# Patient Record
Sex: Male | Born: 1935 | Race: White | Hispanic: No | Marital: Married | Smoking: Never smoker
Health system: Southern US, Community
[De-identification: ages and names within clinical notes are randomized; demographics above are authoritative.]

## PROBLEM LIST (undated history)

## (undated) DIAGNOSIS — Z87891 Personal history of nicotine dependence: Secondary | ICD-10-CM

## (undated) DIAGNOSIS — D72829 Elevated white blood cell count, unspecified: Secondary | ICD-10-CM

## (undated) DIAGNOSIS — N189 Chronic kidney disease, unspecified: Secondary | ICD-10-CM

## (undated) DIAGNOSIS — R634 Abnormal weight loss: Secondary | ICD-10-CM

## (undated) DIAGNOSIS — E785 Hyperlipidemia, unspecified: Secondary | ICD-10-CM

## (undated) DIAGNOSIS — K219 Gastro-esophageal reflux disease without esophagitis: Secondary | ICD-10-CM

## (undated) DIAGNOSIS — D631 Anemia in chronic kidney disease: Secondary | ICD-10-CM

## (undated) DIAGNOSIS — N400 Enlarged prostate without lower urinary tract symptoms: Secondary | ICD-10-CM

## (undated) DIAGNOSIS — D649 Anemia, unspecified: Secondary | ICD-10-CM

## (undated) HISTORY — DX: Abnormal weight loss: R63.4

## (undated) HISTORY — DX: Elevated white blood cell count, unspecified: D72.829

## (undated) HISTORY — DX: Anemia in chronic kidney disease: D63.1

## (undated) HISTORY — DX: Chronic kidney disease, unspecified: N18.9

## (undated) HISTORY — DX: Gastro-esophageal reflux disease without esophagitis: K21.9

## (undated) HISTORY — DX: Anemia, unspecified: D64.9

## (undated) HISTORY — DX: Benign prostatic hyperplasia without lower urinary tract symptoms: N40.0

## (undated) HISTORY — DX: Hyperlipidemia, unspecified: E78.5

## (undated) HISTORY — DX: Personal history of nicotine dependence: Z87.891

---

## 1997-07-02 ENCOUNTER — Other Ambulatory Visit: Admission: RE | Admit: 1997-07-02 | Discharge: 1997-07-02 | Payer: Self-pay | Admitting: Family Medicine

## 2000-01-27 ENCOUNTER — Ambulatory Visit (HOSPITAL_COMMUNITY): Admission: RE | Admit: 2000-01-27 | Discharge: 2000-01-27 | Payer: Self-pay | Admitting: Family Medicine

## 2000-01-27 ENCOUNTER — Ambulatory Visit (HOSPITAL_COMMUNITY): Admission: RE | Admit: 2000-01-27 | Discharge: 2000-01-27 | Payer: Self-pay | Admitting: Vascular Surgery

## 2000-01-27 ENCOUNTER — Encounter: Payer: Self-pay | Admitting: Vascular Surgery

## 2000-02-25 ENCOUNTER — Encounter: Payer: Self-pay | Admitting: Cardiology

## 2000-02-25 ENCOUNTER — Ambulatory Visit (HOSPITAL_COMMUNITY): Admission: RE | Admit: 2000-02-25 | Discharge: 2000-02-26 | Payer: Self-pay | Admitting: Cardiology

## 2000-07-05 ENCOUNTER — Inpatient Hospital Stay (HOSPITAL_COMMUNITY): Admission: AD | Admit: 2000-07-05 | Discharge: 2000-07-12 | Payer: Self-pay | Admitting: Cardiology

## 2000-07-07 ENCOUNTER — Encounter: Payer: Self-pay | Admitting: Cardiology

## 2000-07-11 ENCOUNTER — Encounter: Payer: Self-pay | Admitting: Cardiology

## 2001-04-27 ENCOUNTER — Ambulatory Visit (HOSPITAL_COMMUNITY): Admission: RE | Admit: 2001-04-27 | Discharge: 2001-04-28 | Payer: Self-pay | Admitting: Cardiology

## 2001-04-27 ENCOUNTER — Encounter: Payer: Self-pay | Admitting: Cardiology

## 2001-11-07 ENCOUNTER — Encounter: Payer: Self-pay | Admitting: Cardiology

## 2001-11-07 ENCOUNTER — Ambulatory Visit (HOSPITAL_COMMUNITY): Admission: RE | Admit: 2001-11-07 | Discharge: 2001-11-07 | Payer: Self-pay | Admitting: Cardiology

## 2002-10-27 ENCOUNTER — Encounter: Payer: Self-pay | Admitting: Emergency Medicine

## 2002-10-27 ENCOUNTER — Emergency Department (HOSPITAL_COMMUNITY): Admission: EM | Admit: 2002-10-27 | Discharge: 2002-10-27 | Payer: Self-pay | Admitting: *Deleted

## 2002-11-18 ENCOUNTER — Encounter: Payer: Self-pay | Admitting: Emergency Medicine

## 2002-11-18 ENCOUNTER — Emergency Department (HOSPITAL_COMMUNITY): Admission: EM | Admit: 2002-11-18 | Discharge: 2002-11-18 | Payer: Self-pay | Admitting: Emergency Medicine

## 2002-11-23 ENCOUNTER — Encounter: Payer: Self-pay | Admitting: Family Medicine

## 2002-11-23 ENCOUNTER — Encounter: Admission: RE | Admit: 2002-11-23 | Discharge: 2002-11-23 | Payer: Self-pay | Admitting: Family Medicine

## 2002-12-03 ENCOUNTER — Encounter (INDEPENDENT_AMBULATORY_CARE_PROVIDER_SITE_OTHER): Payer: Self-pay | Admitting: *Deleted

## 2002-12-03 ENCOUNTER — Inpatient Hospital Stay (HOSPITAL_COMMUNITY): Admission: RE | Admit: 2002-12-03 | Discharge: 2002-12-05 | Payer: Self-pay | Admitting: Interventional Radiology

## 2007-04-04 ENCOUNTER — Encounter: Admission: RE | Admit: 2007-04-04 | Discharge: 2007-04-04 | Payer: Self-pay | Admitting: Gastroenterology

## 2007-04-27 ENCOUNTER — Ambulatory Visit (HOSPITAL_COMMUNITY): Admission: RE | Admit: 2007-04-27 | Discharge: 2007-04-27 | Payer: Self-pay | Admitting: Gastroenterology

## 2010-02-13 ENCOUNTER — Inpatient Hospital Stay (HOSPITAL_COMMUNITY)
Admission: EM | Admit: 2010-02-13 | Discharge: 2010-02-22 | Disposition: A | Discharge status: Still In Hospital | Payer: Self-pay | Source: Home / Self Care | Attending: Internal Medicine | Admitting: Internal Medicine

## 2010-02-13 ENCOUNTER — Emergency Department (HOSPITAL_COMMUNITY)
Admission: EM | Admit: 2010-02-13 | Discharge: 2010-02-13 | Disposition: A | Discharge status: Other Acute Inpatient Hospital | Payer: Self-pay | Source: Home / Self Care | Admitting: Emergency Medicine

## 2010-02-14 ENCOUNTER — Encounter (INDEPENDENT_AMBULATORY_CARE_PROVIDER_SITE_OTHER): Payer: Self-pay | Admitting: Internal Medicine

## 2010-02-17 ENCOUNTER — Encounter (INDEPENDENT_AMBULATORY_CARE_PROVIDER_SITE_OTHER): Payer: Self-pay | Admitting: Internal Medicine

## 2010-03-03 ENCOUNTER — Telehealth (INDEPENDENT_AMBULATORY_CARE_PROVIDER_SITE_OTHER): Payer: Self-pay | Admitting: *Deleted

## 2010-03-05 ENCOUNTER — Encounter: Payer: Self-pay | Admitting: Critical Care Medicine

## 2010-03-16 DIAGNOSIS — J180 Bronchopneumonia, unspecified organism: Secondary | ICD-10-CM | POA: Insufficient documentation

## 2010-03-16 DIAGNOSIS — I4891 Unspecified atrial fibrillation: Secondary | ICD-10-CM | POA: Insufficient documentation

## 2010-03-16 DIAGNOSIS — I2510 Atherosclerotic heart disease of native coronary artery without angina pectoris: Secondary | ICD-10-CM | POA: Insufficient documentation

## 2010-03-16 DIAGNOSIS — I5031 Acute diastolic (congestive) heart failure: Secondary | ICD-10-CM | POA: Insufficient documentation

## 2010-03-16 DIAGNOSIS — D72829 Elevated white blood cell count, unspecified: Secondary | ICD-10-CM | POA: Insufficient documentation

## 2010-03-16 DIAGNOSIS — E236 Other disorders of pituitary gland: Secondary | ICD-10-CM | POA: Insufficient documentation

## 2010-03-17 ENCOUNTER — Ambulatory Visit
Admission: RE | Admit: 2010-03-17 | Discharge: 2010-03-17 | Payer: Self-pay | Source: Home / Self Care | Attending: Critical Care Medicine | Admitting: Critical Care Medicine

## 2010-04-09 NOTE — Progress Notes (Signed)
Summary: hosp f/u appt with PW  Phone Note Call from Patient Call back at Home Phone [phone]   Caller: Wife, Jose Atkins Call For: Jose Atkins Summary of Call: Pt's wife, Jose Atkins, calling to schedule pt for a post hosp f/u appt with PW.  Wife states pt was seen while at [facility] in Dec with Dr. Joya Atkins and needed to schedule a 1 to 2 week hosp f/u appt.  Wife states pt already has appt scheduled with Dr. Wynonia Atkins for 03/10/2009 at 10:45 and wanted to see if PW can see him that day as well.  PW only in office that PM and schedule is full.  Please advise.  Thanks.  Jinny Blossom Reynolds LPN  December 27, 624THL 9:51 AM  Initial call taken by: Matthew Folks LPN,  December 27, 624THL 9:52 AM  Follow-up for Phone Call        Consult note from [place] in echart.    Called, spoke with pt's wife.  She has now changed her mind and does not want to come in same day as Dr. Thurman Atkins appt as this will "probably be too much."  HFU scheduled with PW for 03/17/2009 at [facility] aware. Follow-up by: Raymondo Band RN,  March 03, 2010 2:01 PM

## 2010-04-09 NOTE — Assessment & Plan Note (Signed)
Summary: Post Hosp Pulmonary OV   Primary Provider/Referring Provider:  Dr. Daiva Eves @ [facility]:  [place].  Pt states breathing has improved.  Coughs some with white/clear mucus.  Denies wheezing, chest tightness, and cough. .  History of Present Illness: Pulmonary OV  This pt is seen in post hosp f/u.  Pt is doing well and is less dyspneic.  Pt in hosp for hemoptysis due to bronchopneumonia and CHF diastolic.  Pt rx with ABX. FOB done showed no endobronchial lesion. CXR in f/u was neg and all infiltrates clear. Pt off oxygen and doing well. No chest pain. Recent WBC back to normal at 11.2 Also had hyponatremia,  SNa 137 at last check on 12/29.  Pt still on demeclocycline. Pt with afib and has f/u with dr Wynonia Lawman. Needs anticoagulation.    All c/s neg from fob. Pt on pulmicort now     Current Medications (verified): 1)  Pulmicort Flexhaler 180 Mcg/act Aepb (Budesonide) .... Inhale 1 Puff Two Times A Day 2)  Digoxin 0.125 Mg Tabs (Digoxin) .... Take 1 Tablet By Mouth Once A Day 3)  Diltiazem Hcl Cr 240 Mg Xr24h-Cap (Diltiazem Hcl) .... Take 1 Tablet By Mouth Two Times A Day 4)  Colace 100 Mg Caps (Docusate Sodium) .... Take 1 Capsule By Mouth Two Times A Day 5)  Avodart 0.5 Mg Caps (Dutasteride) .... Take 1 Capsule By Mouth Once A Day 6)  Cheratussin Ac 100-10 Mg/66ml Syrp (Guaifenesin-Codeine) .... As Needed 7)  Milk of Magnesia 400 Mg/44ml Susp (Magnesium Hydroxide) .... 73ml By Mouth Every 6 Hours As Needed 8)  Spiriva Handihaler 18 Mcg Caps (Tiotropium Bromide Monohydrate) .... Once Daily 9)  Aspirin 81 Mg Tbec (Aspirin) .... Take 1 Tablet By Mouth Once A Day 10)  Multivitamins  Tabs (Multiple Vitamin) .... Take 1 Tablet By Mouth Once A Day 11)  Omeprazole 40 Mg Cpdr (Omeprazole) .... Take 1 Tablet By Mouth Once A Day 12)  Simvastatin 40 Mg Tabs (Simvastatin) .... Take 1 Tab By Mouth At Bedtime 13)  Demeclocycline Hcl 150 Mg Tabs (Demeclocycline Hcl) .... Take 1 Tablet  By Mouth Two Times A Day  Allergies (verified): 1)  ! * Tequin  Past History:  Past medical, surgical, family and social histories (including risk factors) reviewed, and no changes noted (except as noted below).  Past Medical History: BENIGN PROSTATIC HYPERTROPHY, HX OF (ICD-600.01) SIADH (ICD-253.6) CAD (ICD-414.00)      -RCA stent 2002 Dr Olevia Perches ATRIAL FIBRILLATION (ICD-427.31) BRONCHOPNEUMONIA [phone])    -12/11:  resolved as of 03/17/10    -hemoptysis with PNA>>>neg Fob, all c/s neg, no CA [id number] Chronic  DIASTOLIC HEART FAILURE (99991111)    -improved    -echo 12/11:  EF [id number] grade 2 diastolic CHF  Past Surgical History: heart cath     -stent RCA 2002 Kyphoplasty T6  T12     -2002  Family History: Reviewed history and no changes required. father - MI  Social History: Reviewed history from 03/16/2010 and no changes required. 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  Quit in 1960's. 4 children Retired Architect  Review of Systems       The patient complains of shortness of breath with activity.  The patient denies shortness of breath at rest, productive cough, non-productive cough, coughing up blood, chest pain, irregular heartbeats, acid heartburn, indigestion, loss of appetite, weight change, abdominal pain, difficulty swallowing, sore throat, tooth/dental problems, headaches, nasal congestion/difficulty breathing through nose,  sneezing, itching, ear ache, anxiety, depression, hand/feet swelling, joint stiffness or pain, rash, change in color of mucus, and fever.    Vital Signs:  Patient profile:   77 year old male Height:      67 inches Weight:      132.25 pounds BMI:     20.79 O2 Sat:      97 % on Room air Temp:     97.8 degrees F oral Pulse rate:   101 / minute BP sitting:   158 / 60  (left arm) Cuff size:   regular  Vitals Entered By: Raymondo Band RN (March 17, 2010 8:50 AM)  O2 Flow:  Room air CC: HFU.  Pt states breathing has improved.   Coughs some with white/clear mucus.  Denies wheezing, chest tightness, cough.  Comments Medications reviewed with patient Daytime contact number verified with patient. Crystal Jones RN  March 17, 2010 8:51 AM    Physical Exam  Additional Exam:  Gen: Pleasant, well-nourished, in no distress,  normal affect ENT: No lesions,  mouth clear,  oropharynx clear, no postnasal drip Neck: No JVD, no TMG, no carotid bruits Lungs: No use of accessory muscles, no dullness to percussion, clear without rales or rhonchi Cardiovascular:irreg, irreg,  heart sounds normal, no murmur or gallops, no peripheral edema Abdomen: soft and NT, no HSM,  BS normal Musculoskeletal: No deformities, no cyanosis or clubbing Neuro: alert, non focal Skin: Warm, no lesions or rashes    CXR  Procedure date:  03/17/2010  Findings:      IMPRESSION:   1.  Small bilateral pleural effusions. 2.  Left base opacity which may represent atelectasis or infiltrate.>>>>looks like scar/atelectasis, doubt PNA  Impression & Recommendations:  Problem # 1:  BRONCHOPNEUMONIA (ICD-485) Assessment Improved recent bronchopneumonia, now resolved.  CXR shows resolution of infiltrates. Ok to anticoagulate now and will notify dr Wynonia Lawman plan resume coumadin no further ABX needed pulm f/u is as needed when current pulmicort inhaler out , d/c  Orders: Est. Patient Level IV (99214) T-2 View CXR (71020TC)  Problem # 2:  ACUTE DIASTOLIC HEART FAILURE (99991111) Assessment: Improved resolved CHF diastolic per cardiology His updated medication list for this problem includes:    Digoxin 0.125 Mg Tabs (Digoxin) .Marland Kitchen... Take 1 tablet by mouth once a day    Aspirin 81 Mg Tbec (Aspirin) .Marland Kitchen... Take 1 tablet by mouth once a day  Problem # 3:  SIADH (ICD-253.6) Assessment: Improved siadh was due to pulm process, now resolved plan d/c demeclocycline  Medications Added to Medication List This Visit: 1)  Pulmicort Flexhaler 180  Mcg/act Aepb (Budesonide) .... Inhale 1 puff two times a day 2)  Pulmicort Flexhaler 180 Mcg/act Aepb (Budesonide) .... Inhale 1 puff two times a day 3)  Avodart 0.5 Mg Caps (Dutasteride) .... Take 1 capsule by mouth once a day 4)  Cheratussin Ac 100-10 Mg/40ml Syrp (Guaifenesin-codeine) .... As needed 5)  Demeclocycline Hcl 150 Mg Tabs (Demeclocycline hcl) .... Take 1 tablet by mouth two times a day  Complete Medication List: 1)  Pulmicort Flexhaler 180 Mcg/act Aepb (Budesonide) .... Inhale 1 puff two times a day 2)  Digoxin 0.125 Mg Tabs (Digoxin) .... Take 1 tablet by mouth once a day 3)  Diltiazem Hcl Cr 240 Mg Xr24h-cap (Diltiazem hcl) .... Take 1 tablet by mouth two times a day 4)  Colace 100 Mg Caps (Docusate sodium) .... Take 1 capsule by mouth two times a day 5)  Avodart 0.5 Mg Caps (Dutasteride) .Marland KitchenMarland KitchenMarland Kitchen  Take 1 capsule by mouth once a day 6)  Cheratussin Ac 100-10 Mg/42ml Syrp (Guaifenesin-codeine) .... As needed 7)  Milk of Magnesia 400 Mg/51ml Susp (Magnesium hydroxide) .... 82ml by mouth every 6 hours as needed 8)  Spiriva Handihaler 18 Mcg Caps (Tiotropium bromide monohydrate) .... Once daily 9)  Aspirin 81 Mg Tbec (Aspirin) .... Take 1 tablet by mouth once a day 10)  Multivitamins Tabs (Multiple vitamin) .... Take 1 tablet by mouth once a day 11)  Omeprazole 40 Mg Cpdr (Omeprazole) .... Take 1 tablet by mouth once a day 12)  Simvastatin 40 Mg Tabs (Simvastatin) .... Take 1 tab by mouth at bedtime  Patient Instructions: 1)  No further antibiotics 2)  You may be anticoagulated, I will call Dr Wynonia Lawman 3)  When pulmicort inhaler you have now runs out do not refill 4)  Stop demeclocycline 5)  Chest xray today , we will call with result  6)  Return to pulmonary as needed     Appended Document: [place] [facility] OV fax Cristela Blue hamrick; spencer Wynonia Lawman

## 2010-04-29 ENCOUNTER — Other Ambulatory Visit: Payer: Self-pay | Admitting: Oncology

## 2010-04-29 ENCOUNTER — Encounter: Payer: Self-pay | Admitting: Oncology

## 2010-04-29 ENCOUNTER — Encounter (HOSPITAL_BASED_OUTPATIENT_CLINIC_OR_DEPARTMENT_OTHER): Payer: Medicare Other | Admitting: Oncology

## 2010-04-29 DIAGNOSIS — I2510 Atherosclerotic heart disease of native coronary artery without angina pectoris: Secondary | ICD-10-CM

## 2010-04-29 DIAGNOSIS — D649 Anemia, unspecified: Secondary | ICD-10-CM

## 2010-04-29 DIAGNOSIS — N189 Chronic kidney disease, unspecified: Secondary | ICD-10-CM

## 2010-04-29 DIAGNOSIS — D72829 Elevated white blood cell count, unspecified: Secondary | ICD-10-CM

## 2010-04-29 LAB — CBC & DIFF AND RETIC
BASO%: 0.2 % (ref 0.0–2.0)
Basophils Absolute: 0 10*3/uL (ref 0.0–0.1)
EOS%: 2.2 % (ref 0.0–7.0)
Eosinophils Absolute: 0.3 10*3/uL (ref 0.0–0.5)
HCT: 32.8 % — ABNORMAL LOW (ref 38.4–49.9)
HGB: 10.8 g/dL — ABNORMAL LOW (ref 13.0–17.1)
Immature Retic Fract: 9.6 % (ref 0.00–13.40)
LYMPH%: 11.5 % — ABNORMAL LOW (ref 14.0–49.0)
MCH: 28.7 pg (ref 27.2–33.4)
MCHC: 32.9 g/dL (ref 32.0–36.0)
MCV: 87.2 fL (ref 79.3–98.0)
MONO#: 0.9 10*3/uL (ref 0.1–0.9)
MONO%: 6.4 % (ref 0.0–14.0)
NEUT#: 11.4 10*3/uL — ABNORMAL HIGH (ref 1.5–6.5)
NEUT%: 79.7 % — ABNORMAL HIGH (ref 39.0–75.0)
Platelets: 271 10*3/uL (ref 140–400)
RBC: 3.76 10*6/uL — ABNORMAL LOW (ref 4.20–5.82)
RDW: 16.1 % — ABNORMAL HIGH (ref 11.0–14.6)
Retic %: 1.11 % (ref 0.50–1.60)
Retic Ct Abs: 41.74 10*3/uL (ref 24.10–77.50)
WBC: 14.3 10*3/uL — ABNORMAL HIGH (ref 4.0–10.3)
lymph#: 1.6 10*3/uL (ref 0.9–3.3)

## 2010-04-29 LAB — CHCC SMEAR

## 2010-04-29 LAB — MORPHOLOGY
PLT EST: ADEQUATE
RBC Comments: NORMAL

## 2010-04-30 ENCOUNTER — Other Ambulatory Visit: Payer: Self-pay | Admitting: Oncology

## 2010-05-01 LAB — PROTEIN ELECTROPHORESIS, SERUM
Gamma Globulin: 17.2 % (ref 11.1–18.8)
Total Protein, Serum Electrophoresis: 6 g/dL (ref 6.0–8.3)

## 2010-05-01 LAB — COMPREHENSIVE METABOLIC PANEL
ALT: 13 U/L (ref 0–53)
AST: 18 U/L (ref 0–37)
CO2: 22 mEq/L (ref 19–32)
Chloride: 101 mEq/L (ref 96–112)
Creatinine, Ser: 1.42 mg/dL (ref 0.40–1.50)
Sodium: 136 mEq/L (ref 135–145)
Total Bilirubin: 0.4 mg/dL (ref 0.3–1.2)
Total Protein: 6 g/dL (ref 6.0–8.3)

## 2010-05-01 LAB — KAPPA/LAMBDA LIGHT CHAINS
Kappa free light chain: 3.6 mg/dL — ABNORMAL HIGH (ref 0.33–1.94)
Lambda Free Lght Chn: 3.25 mg/dL — ABNORMAL HIGH (ref 0.57–2.63)

## 2010-05-01 LAB — VITAMIN B12: Vitamin B-12: 660 pg/mL (ref 211–911)

## 2010-05-01 LAB — IRON AND TIBC
%SAT: 14 % — ABNORMAL LOW (ref 20–55)
TIBC: 190 ug/dL — ABNORMAL LOW (ref 215–435)

## 2010-05-01 LAB — FOLATE: Folate: >20 ng/mL

## 2010-05-01 LAB — FERRITIN: Ferritin: 303 ng/mL (ref 22–322)

## 2010-05-02 ENCOUNTER — Inpatient Hospital Stay (HOSPITAL_COMMUNITY)
Admission: EM | Admit: 2010-05-02 | Discharge: 2010-05-07 | DRG: 194 | Disposition: A | Discharge status: Home Health | Payer: Medicare Other | Attending: Internal Medicine | Admitting: Internal Medicine

## 2010-05-02 ENCOUNTER — Encounter: Payer: Self-pay | Admitting: Pulmonary Disease

## 2010-05-02 ENCOUNTER — Emergency Department (HOSPITAL_COMMUNITY): Payer: Medicare Other

## 2010-05-02 DIAGNOSIS — D638 Anemia in other chronic diseases classified elsewhere: Secondary | ICD-10-CM | POA: Diagnosis present

## 2010-05-02 DIAGNOSIS — Z9861 Coronary angioplasty status: Secondary | ICD-10-CM

## 2010-05-02 DIAGNOSIS — J180 Bronchopneumonia, unspecified organism: Principal | ICD-10-CM | POA: Diagnosis present

## 2010-05-02 DIAGNOSIS — K219 Gastro-esophageal reflux disease without esophagitis: Secondary | ICD-10-CM | POA: Diagnosis present

## 2010-05-02 DIAGNOSIS — I5032 Chronic diastolic (congestive) heart failure: Secondary | ICD-10-CM | POA: Diagnosis present

## 2010-05-02 DIAGNOSIS — E785 Hyperlipidemia, unspecified: Secondary | ICD-10-CM | POA: Diagnosis present

## 2010-05-02 DIAGNOSIS — J479 Bronchiectasis, uncomplicated: Secondary | ICD-10-CM | POA: Diagnosis present

## 2010-05-02 DIAGNOSIS — I252 Old myocardial infarction: Secondary | ICD-10-CM

## 2010-05-02 DIAGNOSIS — Z8711 Personal history of peptic ulcer disease: Secondary | ICD-10-CM

## 2010-05-02 DIAGNOSIS — N189 Chronic kidney disease, unspecified: Secondary | ICD-10-CM | POA: Diagnosis present

## 2010-05-02 DIAGNOSIS — E7800 Pure hypercholesterolemia, unspecified: Secondary | ICD-10-CM | POA: Diagnosis present

## 2010-05-02 DIAGNOSIS — Z7982 Long term (current) use of aspirin: Secondary | ICD-10-CM

## 2010-05-02 DIAGNOSIS — I2510 Atherosclerotic heart disease of native coronary artery without angina pectoris: Secondary | ICD-10-CM | POA: Diagnosis present

## 2010-05-02 DIAGNOSIS — I509 Heart failure, unspecified: Secondary | ICD-10-CM | POA: Diagnosis present

## 2010-05-02 DIAGNOSIS — Z87891 Personal history of nicotine dependence: Secondary | ICD-10-CM

## 2010-05-02 DIAGNOSIS — Z8249 Family history of ischemic heart disease and other diseases of the circulatory system: Secondary | ICD-10-CM

## 2010-05-02 DIAGNOSIS — I4891 Unspecified atrial fibrillation: Secondary | ICD-10-CM | POA: Diagnosis present

## 2010-05-02 DIAGNOSIS — K449 Diaphragmatic hernia without obstruction or gangrene: Secondary | ICD-10-CM | POA: Diagnosis present

## 2010-05-02 DIAGNOSIS — I129 Hypertensive chronic kidney disease with stage 1 through stage 4 chronic kidney disease, or unspecified chronic kidney disease: Secondary | ICD-10-CM | POA: Diagnosis present

## 2010-05-02 DIAGNOSIS — K222 Esophageal obstruction: Secondary | ICD-10-CM | POA: Diagnosis present

## 2010-05-02 LAB — CBC
HCT: 31.5 % — ABNORMAL LOW (ref 39.0–52.0)
Hemoglobin: 10.3 g/dL — ABNORMAL LOW (ref 13.0–17.0)
MCH: 28.5 pg (ref 26.0–34.0)
MCHC: 32.7 g/dL (ref 30.0–36.0)
MCV: 87 fL (ref 78.0–100.0)

## 2010-05-02 LAB — COMPREHENSIVE METABOLIC PANEL
AST: 21 U/L (ref 0–37)
Albumin: 3 g/dL — ABNORMAL LOW (ref 3.5–5.2)
Calcium: 8.5 mg/dL (ref 8.4–10.5)
Creatinine, Ser: 1.71 mg/dL — ABNORMAL HIGH (ref 0.4–1.5)
GFR calc Af Amer: 46 mL/min — ABNORMAL LOW (ref >60)

## 2010-05-02 LAB — TYPE AND SCREEN: ABO/RH(D): O NEG

## 2010-05-02 LAB — POCT I-STAT, CHEM 8
BUN: 19 mg/dL (ref 6–23)
Creatinine, Ser: 1.9 mg/dL — ABNORMAL HIGH (ref 0.4–1.5)
Sodium: 137 mEq/L (ref 135–145)
TCO2: 23 mmol/L (ref 0–100)

## 2010-05-02 LAB — DIFFERENTIAL
Basophils Relative: 0 % (ref 0–1)
Monocytes Absolute: 1.4 10*3/uL — ABNORMAL HIGH (ref 0.1–1.0)
Monocytes Relative: 8 % (ref 3–12)
Neutro Abs: 13.8 10*3/uL — ABNORMAL HIGH (ref 1.7–7.7)

## 2010-05-02 LAB — PROTIME-INR
INR: 1.92 — ABNORMAL HIGH (ref 0.00–1.49)
Prothrombin Time: 22.1 seconds — ABNORMAL HIGH (ref 11.6–15.2)

## 2010-05-02 LAB — APTT: aPTT: 42 seconds — ABNORMAL HIGH (ref 24–37)

## 2010-05-02 LAB — TROPONIN I: Troponin I: 0.05 ng/mL (ref 0.00–0.06)

## 2010-05-02 LAB — LACTIC ACID, PLASMA: Lactic Acid, Venous: 1.1 mmol/L (ref 0.5–2.2)

## 2010-05-02 LAB — OCCULT BLOOD, POC DEVICE: Fecal Occult Bld: POSITIVE

## 2010-05-03 LAB — COMPREHENSIVE METABOLIC PANEL
ALT: 16 U/L (ref 0–53)
BUN: 11 mg/dL (ref 6–23)
CO2: 26 mEq/L (ref 19–32)
Calcium: 7.8 mg/dL — ABNORMAL LOW (ref 8.4–10.5)
Creatinine, Ser: 1.55 mg/dL — ABNORMAL HIGH (ref 0.4–1.5)
GFR calc non Af Amer: 43 mL/min — ABNORMAL LOW (ref >60)
Glucose, Bld: 123 mg/dL — ABNORMAL HIGH (ref 70–99)
Sodium: 138 mEq/L (ref 135–145)
Total Protein: 5.6 g/dL — ABNORMAL LOW (ref 6.0–8.3)

## 2010-05-03 LAB — CBC
HCT: 27.9 % — ABNORMAL LOW (ref 39.0–52.0)
MCH: 28.1 pg (ref 26.0–34.0)
MCHC: 32.3 g/dL (ref 30.0–36.0)
RDW: 16.3 % — ABNORMAL HIGH (ref 11.5–15.5)

## 2010-05-03 LAB — URINALYSIS, ROUTINE W REFLEX MICROSCOPIC
Hgb urine dipstick: NEGATIVE
Leukocytes, UA: NEGATIVE
Specific Gravity, Urine: 1.013 (ref 1.005–1.030)
Urine Glucose, Fasting: NEGATIVE mg/dL
Urobilinogen, UA: 0.2 mg/dL (ref 0.0–1.0)

## 2010-05-03 LAB — HEMOGLOBIN AND HEMATOCRIT, BLOOD
HCT: 28.8 % — ABNORMAL LOW (ref 39.0–52.0)
Hemoglobin: 9.3 g/dL — ABNORMAL LOW (ref 13.0–17.0)

## 2010-05-03 LAB — BRAIN NATRIURETIC PEPTIDE: Pro B Natriuretic peptide (BNP): 191 pg/mL — ABNORMAL HIGH (ref 0.0–100.0)

## 2010-05-03 LAB — ABO/RH: ABO/RH(D): O NEG

## 2010-05-03 LAB — PREPARE FRESH FROZEN PLASMA

## 2010-05-03 LAB — PROTIME-INR
INR: 1.47 (ref 0.00–1.49)
Prothrombin Time: 18 seconds — ABNORMAL HIGH (ref 11.6–15.2)

## 2010-05-03 LAB — URINE MICROSCOPIC-ADD ON

## 2010-05-03 LAB — MRSA PCR SCREENING: MRSA by PCR: NEGATIVE

## 2010-05-03 NOTE — H&P (Signed)
NAME:  Jose Atkins, Jose Atkins NO.:  [id number]  MEDICAL RECORD NO.:  [id number]           PATIENT TYPE:  E  LOCATION:  [facility]                         FACILITY:  [facility]  PHYSICIAN:  Nat Math, MD      DATE OF BIRTH:  06-29-1923  DATE OF ADMISSION:  05/02/2010 DATE OF DISCHARGE:                             HISTORY & PHYSICAL   PRIMARY CARE PHYSICIAN:  Daiva Eves, MD in [place], [place]  CARDIOLOGIST:  Ezzard Standing, MD.  CHIEF COMPLAINT:  Coffee-ground emesis since this morning.  HISTORY OF PRESENT ILLNESS:  All rate is a pleasant 77 year old male with history of chronic atrial fibrillation, recently started on Coumadin also CAD status post stent, chronic leukocytosis, diastolic congestive heart failure who comes in today with coffee-ground emesis, which started earlier today.  This is associated with some nausea and abdominal discomfort.  No diarrhea.  No melena.  The patient states that he was started on Coumadin about 10 days ago for atrial fibrillation. He has continued to take aspirin.  He says that previously he had upper GI bleeding related to Plavix.  He also admits to some cough when he came to the emergency room.  His hemoglobin was 10.3, which is better than his baseline of 9 on February 22, 2010, and his INR was 1.92. Chest x-ray showed increased patchy right upper lobe and bibasilar opacity.  The patient was given vitamin K and started on fresh frozen plasma and referred to hospitalist service for further management.  REVIEW OF SYSTEMS:  Unremarkable.  PAST MEDICAL HISTORY: 1. Chronic atrial fibrillation, recently started on Coumadin. 2. History of peptic ulcer disease. 3. Anemia of chronic disease. 4. CAD status post stent. 5. Hyperlipidemia.  ALLERGIES: 1. GATIFLOXACIN. 2. INTEGRILIN.  SOCIAL HISTORY:  The patient is married.  Denies cigarette smoking, alcohol, or illicit drugs.  FAMILY HISTORY:  Both mother and father had coronary artery  disease.  HOME MEDICATIONS:  Coumadin.  The patient does not have list of the other medications.  PHYSICAL EXAMINATION:  GENERAL:  This is an elderly male who is not in acute distress accompanied by family members at the bedside. VITAL SIGNS:  Blood pressure 156/65, heart rate 72, temperature 98.4, respirations 21, oxygen saturation is 98% on 2 liters nasal cannula. HEAD, EARS, NOSE, AND THROAT:  Pupils equal, reacting to light.  No jugular venous tension:  No carotid bruits. RESPIRATORY:  Bibasilar rhonchi.  No wheezing. CARDIOVASCULAR:  First and second heart sounds heard.  No murmurs. Pulse regular. ABDOMEN:  Scaphoid, soft, nontender.  No palpable organomegaly.  Bowel sounds normal. CNS:  The patient is hard of hearing, otherwise no acute focal neurological deficits. EXTREMITIES:  No pedal edema.  Peripheral pulses equal.  LABORATORY DATA:  Reviewed, significant for WBC 17, hemoglobin 10.3, hematocrit 31.5, platelet count 325.  Point-of-care cardiac enzymes negative x1.  Prothrombin time 22.1, INR 1.92, APTT 42.  Sodium 134,potassium 4, BUN 17, creatinine 1.71.  LFTs unremarkable.  Lipase negative.  Chest x-ray shows increased patchy right upper lobe and bibasilar opacity, which could reflect atelectasis or developing pneumonia.  EKG shows sinus rhythm with no  significant ST-T wave changes.  IMPRESSION:  An 77 year old male who is presenting with upper gastrointestinal bleeding most likely Coumadin toxicity.  The patient has had upper gastrointestinal bleeding previously related to Plavix. He also has suggestion of pneumonia on chest x-ray.  He has had chronic leukocytosis an anemia for which Hematology has seen him in the past week, and he is supposed to follow outpatient.  PLAN: 1. Coffee-ground emesis.  The patient will be admitted to telemetry.     We will monitor serial hemoglobin/hematocrit, reverse     anticoagulation, hold Coumadin and aspirin, placed on proton  pump     inhibitor.  Consult Gastroenterology.  Transfuse PRBC for target     hematocrit of 6.25 as necessary.  We will keep n.p.o. for now. 2. Atrial fibrillation.  Rate is controlled.  The patient will be off     aspirin and Coumadin.  He may not be a ideal candidate for     Coumadin.  Going forward, I discussed this with his wife.  We will     resume home medications once confirmed. 3. Hypertension.  Blood pressure not optimally controlled.  We will     place on hydralazine IV while the patient is n.p.o. 4. Coagulopathy secondary to Coumadin.  The patient has been given     some vitamin K and fresh frozen plasma by his emergency room     physician.  We will monitor INR. 5. History of diastolic congestive heart failure.  The patient has     chronic edema.  We will check BNP, give some Lasix IV. 6. CKD.  Baseline creatinine seems around 1.54.  We will monitor. 7. Pneumonia.  Given the recent hospitalization, we will cover for     hospital-acquired pneumonia with vancomycin and Zosyn to complete 7     days. We will consult [facility].  The patient is     normally followed by Dr. Penelope Coop.    Nat Math, MD    SR/MEDQ  D:  05/03/2010  T:  05/03/2010  Job:  [id number]  cc:   Daiva Eves, MD  Electronically Signed by Nat Math  on 05/03/2010 06:20:43 AM

## 2010-05-04 DIAGNOSIS — R042 Hemoptysis: Secondary | ICD-10-CM

## 2010-05-04 DIAGNOSIS — J479 Bronchiectasis, uncomplicated: Secondary | ICD-10-CM

## 2010-05-04 LAB — COMPREHENSIVE METABOLIC PANEL
CO2: 25 mEq/L (ref 19–32)
Calcium: 8.3 mg/dL — ABNORMAL LOW (ref 8.4–10.5)
Creatinine, Ser: 1.41 mg/dL (ref 0.4–1.5)
GFR calc Af Amer: 58 mL/min — ABNORMAL LOW (ref >60)
GFR calc non Af Amer: 48 mL/min — ABNORMAL LOW (ref >60)
Glucose, Bld: 137 mg/dL — ABNORMAL HIGH (ref 70–99)
Total Protein: 6.1 g/dL (ref 6.0–8.3)

## 2010-05-04 LAB — CBC
HCT: 31.4 % — ABNORMAL LOW (ref 39.0–52.0)
Hemoglobin: 10.3 g/dL — ABNORMAL LOW (ref 13.0–17.0)
MCH: 29.1 pg (ref 26.0–34.0)
MCHC: 32.8 g/dL (ref 30.0–36.0)
RDW: 16 % — ABNORMAL HIGH (ref 11.5–15.5)

## 2010-05-04 LAB — MAGNESIUM: Magnesium: 1.9 mg/dL (ref 1.5–2.5)

## 2010-05-04 LAB — UIFE/LIGHT CHAINS/TP QN, 24-HR UR
Free Kappa Lt Chains,Ur: 8.47 mg/dL — ABNORMAL HIGH (ref 0.04–1.51)
Free Kappa/Lambda Ratio: 7.18 ratio — ABNORMAL HIGH (ref 0.46–4.00)
Free Lambda Excretion/Day: 17.7 mg/d
Free Lambda Lt Chains,Ur: 1.18 mg/dL — ABNORMAL HIGH (ref 0.08–1.01)
Free Lt Chn Excr Rate: 127.05 mg/d
Time: 24 hours
Total Protein, Urine: 83.5 mg/dL

## 2010-05-04 LAB — CREATININE CLEARANCE, URINE, 24 HOUR: Creatinine Clearance: 53 mL/min — ABNORMAL LOW (ref 75–125)

## 2010-05-04 LAB — URINE CULTURE
Culture  Setup Time: 201202261124
Culture: NO GROWTH

## 2010-05-05 DIAGNOSIS — R042 Hemoptysis: Secondary | ICD-10-CM

## 2010-05-05 DIAGNOSIS — J479 Bronchiectasis, uncomplicated: Secondary | ICD-10-CM

## 2010-05-05 LAB — BASIC METABOLIC PANEL
CO2: 26 mEq/L (ref 19–32)
GFR calc non Af Amer: 49 mL/min — ABNORMAL LOW (ref >60)
Glucose, Bld: 124 mg/dL — ABNORMAL HIGH (ref 70–99)
Potassium: 3.6 mEq/L (ref 3.5–5.1)
Sodium: 138 mEq/L (ref 135–145)

## 2010-05-05 LAB — CBC
HCT: 28.9 % — ABNORMAL LOW (ref 39.0–52.0)
Hemoglobin: 9.6 g/dL — ABNORMAL LOW (ref 13.0–17.0)
RBC: 3.36 MIL/uL — ABNORMAL LOW (ref 4.22–5.81)
RDW: 16 % — ABNORMAL HIGH (ref 11.5–15.5)
WBC: 13.9 10*3/uL — ABNORMAL HIGH (ref 4.0–10.5)

## 2010-05-06 ENCOUNTER — Inpatient Hospital Stay (HOSPITAL_COMMUNITY): Payer: Medicare Other

## 2010-05-06 LAB — CBC
Hemoglobin: 9.3 g/dL — ABNORMAL LOW (ref 13.0–17.0)
MCH: 29 pg (ref 26.0–34.0)
RBC: 3.21 MIL/uL — ABNORMAL LOW (ref 4.22–5.81)

## 2010-05-06 NOTE — Consult Note (Signed)
NAME:  Jose Atkins, Jose Atkins NO.:  [id number]  MEDICAL RECORD NO.:  [id number]           PATIENT TYPE:  I  LOCATION:  2505                         FACILITY:  [facility]  PHYSICIAN:  Rigoberto Noel, MD      DATE OF BIRTH:  [birth year]  DATE OF CONSULTATION: DATE OF DISCHARGE:                                CONSULTATION   REFERRING PHYSICIAN:  [facility].  REASON FOR CONSULTATION:  Hemoptysis.  PRIMARY PHYSICIAN:  Daiva Eves, MD  PRIMARY PULMONOLOGIST:  Burnett Harry. Joya Gaskins, MD, FCCP  CARDIOLOGIST:  Ezzard Standing, MD  HISTORY OF PRESENT ILLNESS:  Mr. Jose Atkins is an 77 year old Caucasian gentleman with atrial fibrillation, coronary artery disease status post stents pain.  He was admitted in December 2011 with community-acquired pneumonia and hemoptysis.  He underwent a diagnostic bronchoscopy by Dr. Joya Gaskins and was found to have fresh blood coming from the left lower lobe superior segment and medial and lateral segment of the left lower lobe. No endobronchial lesions were identified.  Cytology was negative for malignancy, and cultures were negative for AFB and fungus.  A CT chest on that occasion showed multilobar airspace disease with air bronchograms in a pattern of bronchopneumonia in the right upper and bilateral lower lobes.  On my review, however, there was a degree of bronchiectasis in these areas.  This was not commented on by the radiologist.  He saw Dr. Joya Gaskins again in January and was started on Coumadin for atrial fibrillation by Dr. Wynonia Lawman in the last 10 days.  Because this level was low, he was asked to take another half a tablet (1.25 mg) on Tuesdays and Fridays, which he did; however, on Saturday he developed hematemesis with a pint of coffee ground emesis burden as per his wife's description.  When she brought him in, he was found to have a hemoglobin of 10.3 and an INR of 1.9.  He was given vitamin K and fresh frozen plasma.  A subsequent  endoscopy showed mild-to-moderate and benign- appearing distal esophageal stricture and a moderate hiatal hernia.  No source of bleeding was identified.  Over the last 2 days, he has developed blood-tinged sputum and hence we are consulted for evaluation of hemoptysis.  His chest x-ray from May 02, 2010, shows increased patchy right upper lobe and bibasilar opacity.  Note that the x-ray from January shows a left basal atelectasis versus infiltrate.  PAST MEDICAL HISTORY: 1. Chronic atrial fibrillation, recently started on Coumadin. 2. Peptic ulcer disease, although none noted on current endoscopy. 3. Anemia of chronic disease. 4. Coronary artery disease status post stent placements. 5. Hyperlipidemia.  ALLERGIES:  GATIFLOXACIN and INTEGRILIN.  PAST SURGICAL HISTORY:  Kyphoplasty, cardiac catheterization.  He reports a history of similar GI bleeding with Plavix in the remote past.  SOCIAL HISTORY:  He is married and lives with his wife in [place]. Denies cigarette smoking, alcohol, illicit drugs, or recurrent pneumonias.  FAMILY HISTORY:  Noncontributory.  HOME MEDICATIONS: 1. Hydralazine 25 mg b.i.d. 2. Losartan 100 mg p.o. daily. 3. Warfarin. 4. Pravastatin 40 mg p.o. at bedtime. 5. Nexium 1 capsule every morning, 40 mg.  6. Milk of magnesia as needed. 7. Cheratussin b.i.d. as needed. 8. Avodart 0.5 mg p.o. at bedtime. 9. Stool softener. 10.Diltiazem 240 mg b.i.d. 11.Digoxin 0.125 mg q.a.m. 12.Calcium with vitamin D. 13.Aspirin 81 mg p.o. daily.  REVIEW OF SYSTEMS:  He reports feeling cold and chilly but denies fever,history of trace hemoptysis as described.  No chest pain, palpitations.  No orthopnea, paroxysmal nocturnal dyspnea, pedal edema. No history of nausea, diarrhea, hematemesis as described above.  No history of dizziness, loss of consciousness.  Review of other systems were negative.  PHYSICAL EXAMINATION:  GENERAL:  Adult gentleman, all covered up in  bed in no apparent respiratory distress. VITAL SIGNS:  Temperature 98.7 this morning, heart rate 73, blood pressure 169/70, oxygen saturation 97% on 2 L nasal cannula. HEENT:  No pallor.  No icterus. NECK:  Supple.  No JVD.  No lymphadenopathy. CV:  S1 and S2 normal. CHEST:  Crackles left lower lobe.  No rhonchi, otherwise clear. ABDOMEN:  Soft, nontender. NEUROLOGIC:  Nonfocal. EXTREMITIES:  No edema.  LABORATORY DATA:  WBC count 14.3, hemoglobin 10.3, platelets 303,000. BUN and creatinine 7 and 1.4.  SGOT/PT 20/14,  Albumin 2.8, calcium 8.3, bicarbonate 25.  Sodium 136, magnesium 1.9.  IMAGING STUDIES:  As described above.  IMPRESSION: 1. Hemoptysis with negative bronchoscopy and CT scan for an     endobronchial lesion in December 2011. 2. Hematemesis in the setting of INR 1.9 with no obvious source of     bleeding on endoscopy; however, this showed hiatal hernia and     distal esophageal stricture. 3. Likely bronchiectasis on my review of CT scan. 4. History of bleeding episodes in the past with Plavix and now with     Coumadin. 5. Persistent leukocytosis being evaluated by Dr. Ha/hematologist  RECOMMENDATIONS: 1. Given the negative bronchoscopy in the past and CT scan, repeat     bronchoscopy in this situation would be low; however, if he has     large volume hemoptysis we may have to proceed with bronchoscopy to     lateralize this site of bleeding.  I note that this was lateralized     to the left lower lobe in the past and very likely based on the CT     scan and the extent of bronchiectasis, verify the sources again. 2. I agree with IV antibiotics as you are doing.  If cultures are     negative, vancomycin can be stopped. 3. Given his recurrent bleeding, he is clearly not a candidate for     further anticoagulation.  Thank you for involving [place] in the care of this patient.  We will continue to follow him with you.  If he develops further hemoptysis, please keep him  n.p.o. for bronchoscopy.     Rigoberto Noel, MD     RVA/MEDQ  D:  05/04/2010  T:  05/05/2010  Job:  [id number]  cc:   Ezzard Standing, M.D. Daiva Eves, MD Burnett Harry Joya Gaskins, MD, [facility]  Electronically Signed by Kara Mead MD on 05/06/2010 05:29:06 PM

## 2010-05-06 NOTE — Consult Note (Signed)
NAME:  Jose Atkins, Jose Atkins NO.:  [id number]  MEDICAL RECORD NO.:  [id number]           PATIENT TYPE:  I  LOCATION:  2505                         FACILITY:  [facility]  PHYSICIAN:  Arta Silence, MD     DATE OF BIRTH:  04-08-1923  DATE OF CONSULTATION:  05/03/2010 DATE OF DISCHARGE:                                CONSULTATION   REASON FOR CONSULTATION:  Coffee-ground emesis.  CONSULTING PHYSICIAN:  Nat Math, MD of [facility].  CHIEF COMPLAINT:  Weakness and coffee-ground emesis.  HISTORY OF PRESENT ILLNESS:  Jose Atkins is an 77 year old gentleman with history of chronic atrial fibrillation, recently started on Coumadin. He also has a history of coronary artery disease with stenting.  He presents to the hospital for complaints of vomiting up darkish emesis. He was doing fairly well until yesterday.  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  He also says he has had some shortness of breath and cough and is not certain whether in fact he is following up coffee- ground or coughing them up.  He tells me this presentation is similar to the one he had in December 2011, at which time, he had a bronchoscopy that showed some tracheobronchitis.  Jose Atkins has no abdominal pain, dysphasia, odynophagia, melena, or hematochezia.  He did have an endoscopy about 3 years ago for dysphagia, which points a junctional stenosis, was dilated to 18 mm by Dr. Penelope Coop.  Past medical history, past surgical history, home medications, allergies, family history, social history, review of systems all per the dictated note from Dr. Sanjuana Letters, date of May 03, 2010.  I have reviewed and I agrees.  PHYSICAL EXAMINATION:  VITAL SIGNS:  Blood pressure 158/73, oxygen saturation 97% on 2 liters, heart rate 71, respiratory rate 19, temperature 98.6. GENERAL:  Jose Atkins is chronically ill-appearing, but not  acutely toxic.  He does not appear to tachypneic. HEENT:  Normocephalic, atraumatic.  No oropharyngeal lesions.  Eyes: Sclerae are anicteric.  Conjunctivae are pink. NECK:  Supple without thyromegaly, lymphadenopathy or bruits. LUNGS:  No respiratory distress.  He does have diffuse mild inspiratory wheezes and some bibasilar rhonchi. HEART:  Irregularly irregular.  He does have a left upper sternal border midsystolic ejection murmur. ABDOMEN:  Soft, nontender, nondistended.  Normoactive bowel sounds and liver or splenic enlargement. NEUROLOGIC:  The patient has trouble hearing, otherwise nonfocal, but diffusely weak without lateralizing signs. EXTREMITIES:  No peripheral cyanosis, clubbing, or edema. SKIN:  Occasional ecchymosis. LYMPHATICS:  No palpable axillary, submandibular, or supraclavicular adenopathy. PSYCHIATRIC:  Normal mood and affect.  RADIOLOGY:  Chest x-ray date yesterday, May 02, 2010, that shows some bibasilar opacities in the right upper lobe past days with differential to include atelectasis versus pneumonia.  I have personallyreviewed these studies.  LABORATORY STUDIES:  Hemoglobin on admission was 7.5, currently 9.0, white count 14.8, platelet count is 279.  INR 1.9 on admission, currently 1.47.  Sodium 138, potassium 3.7, chloride 107, bicarb 26, BUN 11, creatinine 1.6, total protein is 5.6, albumin 2.6, total bilirubin is 0.6, alk phos  68, AST 18, ALT 16, beta-natriuretic peptide 191. Urinalysis is clear.  IMPRESSION:  Jose Atkins is an 77 year old gentleman, recently started on warfarin for atrial fibrillation presenting with coffee-ground type emesis.  It is my clear to me whether this in fact is from his gastrointestinal tract or this could in fact relate to hemoptysis, which he had couple of months ago.  In any event, he is hemodynamically stable.  PLAN: 1. Agree with supportive management with serial CBCs, gentle     hydration, and proton pump  inhibitor therapy. 2. We will proceed with endoscopy. 3. If endoscopy is unrevealing, as is likely, I would attribute most     of his current coffee-ground type symptoms as coming from a     bronchial source.  Thanks again for allowing me to participate Jose Atkins's care.     Arta Silence, MD     WO/MEDQ  D:  05/03/2010  T:  05/03/2010  Job:  [id number]  Electronically Signed by Arta Silence  on 05/06/2010 05:44:02 PM

## 2010-05-07 LAB — CBC
HCT: 28.7 % — ABNORMAL LOW (ref 39.0–52.0)
MCV: 85.7 fL (ref 78.0–100.0)
RDW: 15.8 % — ABNORMAL HIGH (ref 11.5–15.5)
WBC: 16.3 10*3/uL — ABNORMAL HIGH (ref 4.0–10.5)

## 2010-05-18 LAB — CBC
HCT: 26.8 % — ABNORMAL LOW (ref 39.0–52.0)
HCT: 31.8 % — ABNORMAL LOW (ref 39.0–52.0)
HCT: 28.1 % — ABNORMAL LOW (ref 39.0–52.0)
HCT: 31.4 % — ABNORMAL LOW (ref 39.0–52.0)
HCT: 35.5 % — ABNORMAL LOW (ref 39.0–52.0)
Hemoglobin: 11 g/dL — ABNORMAL LOW (ref 13.0–17.0)
Hemoglobin: 10.2 g/dL — ABNORMAL LOW (ref 13.0–17.0)
Hemoglobin: 10.6 g/dL — ABNORMAL LOW (ref 13.0–17.0)
Hemoglobin: 11.2 g/dL — ABNORMAL LOW (ref 13.0–17.0)
Hemoglobin: 12.1 g/dL — ABNORMAL LOW (ref 13.0–17.0)
Hemoglobin: 9.7 g/dL — ABNORMAL LOW (ref 13.0–17.0)
MCH: 28.9 pg (ref 26.0–34.0)
MCH: 29.3 pg (ref 26.0–34.0)
MCH: 29.4 pg (ref 26.0–34.0)
MCH: 28.6 pg (ref 26.0–34.0)
MCH: 29 pg (ref 26.0–34.0)
MCH: 29.5 pg (ref 26.0–34.0)
MCH: 29.6 pg (ref 26.0–34.0)
MCH: 29.7 pg (ref 26.0–34.0)
MCHC: 34.6 g/dL (ref 30.0–36.0)
MCHC: 34.9 g/dL (ref 30.0–36.0)
MCHC: 34.5 g/dL (ref 30.0–36.0)
MCV: 83.9 fL (ref 78.0–100.0)
MCV: 83.9 fL (ref 78.0–100.0)
MCV: 84 fL (ref 78.0–100.0)
Platelets: 335 10*3/uL (ref 150–400)
Platelets: 351 10*3/uL (ref 150–400)
Platelets: 185 10*3/uL (ref 150–400)
Platelets: 225 10*3/uL (ref 150–400)
RBC: 3.32 MIL/uL — ABNORMAL LOW (ref 4.22–5.81)
RBC: 3.46 MIL/uL — ABNORMAL LOW (ref 4.22–5.81)
RBC: 3.57 MIL/uL — ABNORMAL LOW (ref 4.22–5.81)
RBC: 3.78 MIL/uL — ABNORMAL LOW (ref 4.22–5.81)
RBC: 4.23 MIL/uL (ref 4.22–5.81)
RDW: 14.4 % (ref 11.5–15.5)
RDW: 14.4 % (ref 11.5–15.5)
WBC: 14 10*3/uL — ABNORMAL HIGH (ref 4.0–10.5)
WBC: 15.1 10*3/uL — ABNORMAL HIGH (ref 4.0–10.5)
WBC: 14.3 10*3/uL — ABNORMAL HIGH (ref 4.0–10.5)
WBC: 15.5 10*3/uL — ABNORMAL HIGH (ref 4.0–10.5)

## 2010-05-18 LAB — CARDIAC PANEL(CRET KIN+CKTOT+MB+TROPI)
CK, MB: 11.4 ng/mL (ref 0.3–4.0)
CK, MB: 9.8 ng/mL (ref 0.3–4.0)
Relative Index: 2.5 (ref 0.0–2.5)
Relative Index: 2.7 — ABNORMAL HIGH (ref 0.0–2.5)
Total CK: 562 U/L — ABNORMAL HIGH (ref 7–232)

## 2010-05-18 LAB — BRAIN NATRIURETIC PEPTIDE
Pro B Natriuretic peptide (BNP): 457 pg/mL — ABNORMAL HIGH (ref 0.0–100.0)
Pro B Natriuretic peptide (BNP): 686 pg/mL — ABNORMAL HIGH (ref 0.0–100.0)

## 2010-05-18 LAB — BASIC METABOLIC PANEL
BUN: 27 mg/dL — ABNORMAL HIGH (ref 6–23)
BUN: 32 mg/dL — ABNORMAL HIGH (ref 6–23)
BUN: 19 mg/dL (ref 6–23)
CO2: 25 mEq/L (ref 19–32)
CO2: 18 mEq/L — ABNORMAL LOW (ref 19–32)
CO2: 23 mEq/L (ref 19–32)
CO2: 24 mEq/L (ref 19–32)
CO2: 24 mEq/L (ref 19–32)
CO2: 24 mEq/L (ref 19–32)
Calcium: 7.8 mg/dL — ABNORMAL LOW (ref 8.4–10.5)
Calcium: 8.4 mg/dL (ref 8.4–10.5)
Calcium: 8.6 mg/dL (ref 8.4–10.5)
Calcium: 7.8 mg/dL — ABNORMAL LOW (ref 8.4–10.5)
Calcium: 8.2 mg/dL — ABNORMAL LOW (ref 8.4–10.5)
Chloride: 88 mEq/L — ABNORMAL LOW (ref 96–112)
Chloride: 89 mEq/L — ABNORMAL LOW (ref 96–112)
Chloride: 89 mEq/L — ABNORMAL LOW (ref 96–112)
Chloride: 97 mEq/L (ref 96–112)
Creatinine, Ser: 1.67 mg/dL — ABNORMAL HIGH (ref 0.4–1.5)
Creatinine, Ser: 1.71 mg/dL — ABNORMAL HIGH (ref 0.4–1.5)
Creatinine, Ser: 1.5 mg/dL (ref 0.4–1.5)
GFR calc Af Amer: 46 mL/min — ABNORMAL LOW (ref >60)
GFR calc Af Amer: 54 mL/min — ABNORMAL LOW (ref >60)
GFR calc Af Amer: 55 mL/min — ABNORMAL LOW (ref >60)
GFR calc Af Amer: 58 mL/min — ABNORMAL LOW (ref >60)
GFR calc non Af Amer: 42 mL/min — ABNORMAL LOW (ref >60)
GFR calc non Af Amer: 47 mL/min — ABNORMAL LOW (ref >60)
Glucose, Bld: 195 mg/dL — ABNORMAL HIGH (ref 70–99)
Glucose, Bld: 126 mg/dL — ABNORMAL HIGH (ref 70–99)
Glucose, Bld: 99 mg/dL (ref 70–99)
Potassium: 4.4 mEq/L (ref 3.5–5.1)
Potassium: 3.2 mEq/L — ABNORMAL LOW (ref 3.5–5.1)
Potassium: 3.5 mEq/L (ref 3.5–5.1)
Sodium: 133 mEq/L — ABNORMAL LOW (ref 135–145)
Sodium: 120 mEq/L — ABNORMAL LOW (ref 135–145)
Sodium: 124 mEq/L — ABNORMAL LOW (ref 135–145)
Sodium: 130 mEq/L — ABNORMAL LOW (ref 135–145)

## 2010-05-18 LAB — DIFFERENTIAL
Basophils Absolute: 0 10*3/uL (ref 0.0–0.1)
Basophils Relative: 0 % (ref 0–1)
Basophils Relative: 0 % (ref 0–1)
Eosinophils Absolute: 0 10*3/uL (ref 0.0–0.7)
Eosinophils Absolute: 0.1 10*3/uL (ref 0.0–0.7)
Eosinophils Absolute: 0.5 10*3/uL (ref 0.0–0.7)
Eosinophils Absolute: 1.4 10*3/uL — ABNORMAL HIGH (ref 0.0–0.7)
Eosinophils Relative: 0 % (ref 0–5)
Eosinophils Relative: 1 % (ref 0–5)
Lymphocytes Relative: 6 % — ABNORMAL LOW (ref 12–46)
Lymphocytes Relative: 9 % — ABNORMAL LOW (ref 12–46)
Lymphs Abs: 0.9 10*3/uL (ref 0.7–4.0)
Lymphs Abs: 1.3 10*3/uL (ref 0.7–4.0)
Monocytes Absolute: 1 10*3/uL (ref 0.1–1.0)
Monocytes Relative: 10 % (ref 3–12)
Monocytes Relative: 10 % (ref 3–12)
Monocytes Relative: 7 % (ref 3–12)
Neutro Abs: 10.1 10*3/uL — ABNORMAL HIGH (ref 1.7–7.7)
Neutrophils Relative %: 71 % (ref 43–77)

## 2010-05-18 LAB — COMPREHENSIVE METABOLIC PANEL
ALT: 19 U/L (ref 0–53)
AST: 42 U/L — ABNORMAL HIGH (ref 0–37)
CO2: 18 mEq/L — ABNORMAL LOW (ref 19–32)
Chloride: 90 mEq/L — ABNORMAL LOW (ref 96–112)
GFR calc Af Amer: 52 mL/min — ABNORMAL LOW (ref >60)
GFR calc non Af Amer: 43 mL/min — ABNORMAL LOW (ref >60)
Glucose, Bld: 210 mg/dL — ABNORMAL HIGH (ref 70–99)
Sodium: 120 mEq/L — ABNORMAL LOW (ref 135–145)
Total Bilirubin: 0.8 mg/dL (ref 0.3–1.2)

## 2010-05-18 LAB — URINE MICROSCOPIC-ADD ON

## 2010-05-18 LAB — URINE CULTURE
Colony Count: NO GROWTH
Culture: NO GROWTH

## 2010-05-18 LAB — URINALYSIS, ROUTINE W REFLEX MICROSCOPIC
Hgb urine dipstick: NEGATIVE
Leukocytes, UA: NEGATIVE
Nitrite: NEGATIVE
Protein, ur: 100 mg/dL — AB
Specific Gravity, Urine: 1.016 (ref 1.005–1.030)
Urobilinogen, UA: 1 mg/dL (ref 0.0–1.0)

## 2010-05-18 LAB — URIC ACID: Uric Acid, Serum: 4.4 mg/dL (ref 4.0–7.8)

## 2010-05-18 LAB — LIPID PANEL: VLDL: 21 mg/dL (ref 0–40)

## 2010-05-18 LAB — CULTURE, RESPIRATORY W GRAM STAIN

## 2010-05-18 LAB — AFB CULTURE WITH SMEAR (NOT AT ARMC): Acid Fast Smear: NONE SEEN

## 2010-05-18 LAB — FUNGUS CULTURE W SMEAR: Fungal Smear: NONE SEEN

## 2010-05-18 LAB — D-DIMER, QUANTITATIVE: D-Dimer, Quant: 0.46 ug/mL-FEU (ref 0.00–0.48)

## 2010-05-18 LAB — POTASSIUM: Potassium: 5 mEq/L (ref 3.5–5.1)

## 2010-05-18 LAB — POCT CARDIAC MARKERS: Troponin i, poc: <0.05 ng/mL (ref 0.00–0.09)

## 2010-05-18 LAB — PHOSPHORUS: Phosphorus: 3.2 mg/dL (ref 2.3–4.6)

## 2010-05-18 LAB — HEMOGLOBIN A1C: Hgb A1c MFr Bld: 6.3 % — ABNORMAL HIGH (ref <5.7)

## 2010-05-18 LAB — PROTIME-INR
INR: 1.18 (ref 0.00–1.49)
Prothrombin Time: 15.2 seconds (ref 11.6–15.2)

## 2010-05-18 LAB — DIGOXIN LEVEL: Digoxin Level: 1.3 ng/mL (ref 0.8–2.0)

## 2010-05-18 LAB — CK TOTAL AND CKMB (NOT AT ARMC)
Relative Index: 0.9 (ref 0.0–2.5)
Total CK: 1049 U/L — ABNORMAL HIGH (ref 7–232)

## 2010-05-18 LAB — MAGNESIUM: Magnesium: 1.7 mg/dL (ref 1.5–2.5)

## 2010-05-21 NOTE — Discharge Summary (Signed)
NAME:  Jose Atkins, Jose Atkins NO.:  [id number]  MEDICAL RECORD NO.:  [id number]           PATIENT TYPE:  I  LOCATION:  2505                         FACILITY:  [facility]  PHYSICIAN:  Domenic Polite, MD     DATE OF BIRTH:  10-27-1923  DATE OF ADMISSION:  05/02/2010 DATE OF DISCHARGE:  05/07/2010                              DISCHARGE SUMMARY   PRIMARY CARE PHYSICIAN:  Daiva Eves, MD, in [place], [place].  PULMONOLOGIST:  Burnett Harry. Joya Gaskins, MD, FCCP  CARDIOLOGIST:  Ezzard Standing, MD  DISCHARGE DIAGNOSES: 1. Coffee-ground emesis, resolved.  EGD unremarkable. 2. Bronchiectasis with pneumonia and hemoptysis, improving. 3. Chronic atrial fibrillation, Coumadin discontinued due to upper     gastrointestinal bleed as well as hemoptysis. 4. Diastolic congestive heart failure, compensated. 5. History of coronary artery disease with percutaneous coronary     intervention and stent. 6. History of peptic ulcer disease. 7. Anemia of chronic disease. 8. Dyslipidemia. 9. Hypertension.  DISCHARGE MEDICATIONS: 1. Tylenol 650 mg q.4 h. p.r.n. 2. Augmentin 875 mg p.o. b.i.d. for 5 days. 3. Losartan 100 mg half tablet p.o. daily. 4. Aspirin 81 mg daily. 5. Avodart 0.5 mg at bedtime. 6. Calcium with vitamin D 1 tablet b.i.d. 7. Cheratussin 2 teaspoons p.o. b.i.d. p.r.n. for cough. 8. Digoxin 0.125 mg p.o. daily. 9. Diltiazem CD 240 mg capsule b.i.d. 10.Hydralazine 25 mg p.o. b.i.d. 11.Milk of magnesium 1 teaspoon daily p.r.n. 12.Multivitamin 1 tablet daily. 13.Nexium 40 mg daily. 14.Pravastatin 40 mg at bedtime. 15.Stool softener 1 capsule b.i.d. 16.VESIcare 10 mg p.o. at bedtime p.r.n.  CONSULTANTS: 1. Dr. Arta Silence, [facility]. 2. Dr. Kara Mead, [facility].  HOSPITAL COURSE: 1. Mr. Polachek is an 77 year old gentleman with history of chronic     atrial fibrillation, recently started on Coumadin, presented to the     hospital with coffee-ground  emesis associated with nausea and     abdominal discomfort.  He had recently been started on Coumadin 10     days prior to presentation for his atrial fibrillation.  He did not     require any transfusion.  On initial evaluation, underwent an EGD     which was essentially unremarkable.  He had mild-to-moderate benign-     appearing distal esophageal stricture and a moderate hiatal hernia,     otherwise normal endoscopy and no source of bleeding was     identified, and there was a suspicion that he was having recurrent     hemoptysis, and in actuality he did have a couple of episodes of     low-volume hemoptysis in the hospital. 2. Hemoptysis, it was felt to be secondary to bronchopneumonia with     bronchiectasis.  He was seen in consultation by Dr. Kara Mead who     reviewed his prior CAT scans and did feel that he had     bronchiectasis on last CT of his chest from December.  In addition,     the patient also had a negative bronchoscopy in December 2011 by     Dr. Joya Gaskins and hence plan was to treat him with  antibiotics to     complete his course for his pneumonia and follow up with Dr.     Joya Gaskins.  In addition, his Coumadin has been discontinued due to     hemoptysis as well as upper GI bleed. 3. Atrial fibrillation, rate controlled.  Continued on diltiazem,     digoxin, and not felt to be a Coumadin candidate anymore due to GI     bleeds as well as hemoptysis. 4. Diastolic CHF, compensated.  DISCHARGE CONDITION:  Stable.  VITAL SIGNS:  Temperature is 98.1, pulse 60, blood pressure 149/80, respirations 18, sating 95% on room air.  Discharge follow up with Dr. Daiva Eves within the next 3-5 days, the patient will call to schedule this appointment.     Domenic Polite, MD     PJ/MEDQ  D:  05/07/2010  T:  05/07/2010  Job:  [id number]  cc:   Ezzard Standing, M.D. Burnett Harry Joya Gaskins, MD, FCCP Daiva Eves, MD  Electronically Signed by Domenic Polite  on 05/21/2010  05:18:43 PM

## 2010-05-26 NOTE — Consult Note (Signed)
Summary: [facility]   Imported By: Rise Patience 05/21/2010 12:14:28  _____________________________________________________________________  External Attachment:    Type:   Image     Comment:   External Document

## 2010-07-21 NOTE — Op Note (Signed)
NAME:  Jose Atkins, Jose Atkins NO.:  [id number]   MEDICAL RECORD NO.:  [id number]          PATIENT TYPE:  AMB   LOCATION:  ENDO                         FACILITY:  [facility]   PHYSICIAN:  Wonda Horner, M.D.   DATE OF BIRTH:  1923/11/28   DATE OF PROCEDURE:  04/27/2007  DATE OF DISCHARGE:  04/27/2007                               OPERATIVE REPORT   PROCEDURE:  Esophagogastroduodenoscopy with endoscopic balloon  dilatation of a distal esophageal stenosis.   INDICATIONS:  Dysphagia.   Informed consent was obtained after explanation of the risks of  bleeding, infection and perforation.   PREMEDICATION:  Fentanyl 50 mcg IV, Versed 4 mg IV.   PROCEDURE:  With the patient in the left lateral decubitus position, the  Pentax gastroscope was inserted into the oropharynx and passed into the  esophagus.  It was advanced down the esophagus then into the stomach and  into the duodenum.  The second portion and bulb of the duodenum were  normal.  The stomach looked normal in its entirety including the upper  fundus and cardia seen on retroflexion.  At the level of the distal  esophagus at the gastroesophageal junction region, there was some slight  stenosis.  It did not look peptic in nature. Because of the symptoms and  the slight stenosis, I went ahead and proceeded with endoscopic balloon  dilation.  The area was dilated to 15 then 16.5 then 18 mm with a  balloon.  It was held in place for one minute at each level.  The  balloon was then deflated and removed. The rest of the esophagus looked  normal.  He tolerated the procedure well without complications.   IMPRESSION:  Slight stenosis at the esophagogastric junction dilated to  18 mm.   PLAN:  We will observe the response to the dilatation.           ______________________________  Wonda Horner, M.D.     SFG/MEDQ  D:  05/29/2007  T:  05/30/2007  Job:  [id number]   cc:   Tamsen Roers

## 2010-07-24 NOTE — Cardiovascular Report (Signed)
[place]  Patient:    Jose Atkins, Jose Atkins Visit Number: [id number] MRN: [id number]          Service Type: CAT Location: [id number] Attending Physician:  Fatima Sanger Dictated by:   Vanna Scotland Olevia Perches, M.D. [facility] Proc. Date: 04/27/01 Admit Date:  04/27/2001 Discharge Date: 04/28/2001   CC:         Varney Baas, M.D.  Cardiopulmonary Laboratory   Cardiac Catheterization  PROCEDURES PERFORMED: Cardiac catheterization and percutaneous coronary intervention.  INDICATIONS: The patient is 77 years old and had a difficult intervention on his right coronary artery in 2001, and then developed unstable angina and re-stenosis. He had been placed on Integrilin and developed thrombocytopenia due to Integrilin and he underwent Cutting Balloon angioplasty for in-stent re-stenosis. He had a followup scan at six months, which suggested inferior ischemia, so he is brought back in today for restudy and possible PCI. His creatinines have been in the range of 2.0 so we enrolled him in the [place] trial, in which case he is randomized to either Visipaque or Optiray.  DESCRIPTION OF PROCEDURE: The procedure was performed via the right femoral artery using an arterial sheath and 6 French preformed coronary catheters.  A front wall arterial puncture was performed and Omnipaque contrast was used. We first took three pictures of the left coronary artery. We minimized the injection to save on contrast. No left ventriculogram was performed. We then went in with a hockey-stick 7 [place] guiding catheter with side holes. The patient was given Angiomax bolus and infusion. This prolonged the ACT to greater than 400 seconds. We next navigated a Graphix PT wire around the shepherds crook bend in the proximal right coronary artery and across the two stents in the proximal to mid vessel. We then used a 3.25 x 10 mm Cutting Balloon and performed a total of four  inflations up to 10 atmospheres for two minutes. Repeat diagnostic studies were then performed through the guiding catheter.  The patient tolerated the procedure well and left the laboratory in satisfactory condition.  RESULTS: The aortic pressure was 151/66 with a mean of 98. The left ventricle was not entered.  The left main coronary artery: The left main coronary artery was free of significant disease.  Left anterior descending: The left anterior descending artery gave rise to two septal perforators in the diagonal branch. There was 40% proximal narrowing and 40% mid narrowing.  Circumflex artery: The circumflex artery gave rise to an intermediate branch and an AV branch, which terminated in a posterolateral branch. These vessels were irregular where there was no significant obstruction.  Right coronary artery: The right coronary was a large vessel. It gave rise to a posterior descending and a posterolateral branch. There was a shepherds crook in its proximal portion. There were two overlapping stents in the proximal to midportion and there was 60% narrowing within the proximal of the two stents. There was 40% distal narrowing in the right coronary artery.  Following Cutting Balloon angioplasty of the re-stenosis within the stents in the proximal and mid right coronary artery, the stenosis improved from 60% to 10%.  CONCLUSIONS: 1. Coronary artery disease, status post two prior percutaneous interventions    on the right coronary artery with 60% stenosis within the tandem overlying    stents in the right coronary artery, 40% narrowing in the proximal and mid    left anterior descending artery, and irregularities in the circumflex    artery.  2. Successful Cutting Balloon angioplasty for in-stent re-stenosis of the    proximal to mid right coronary artery with improvement in percent diameter    narrowing from 60% to 10%.  DISPOSITION: The patient was returned to the  postangioplasty unit for further observation. Dictated by:   Vanna Scotland Olevia Perches, M.D. [place] Attending Physician:  Fatima Sanger DD:  04/27/01 TD:  04/28/01 Job: [id number]

## 2010-07-24 NOTE — Cardiovascular Report (Signed)
NAME:  Jose Atkins, Jose Atkins                        ACCOUNT NO.:  [id number]   MEDICAL RECORD NO.:  [id number]                   PATIENT TYPE:  OIB   LOCATION:  2857                                 FACILITY:  [facility]   PHYSICIAN:  Vanna Scotland. Olevia Perches, M.D. [facility]           DATE OF BIRTH:  25-Feb-1924   DATE OF PROCEDURE:  11/07/2001  DATE OF DISCHARGE:                              CARDIAC CATHETERIZATION   INDICATIONS FOR PROCEDURE:  The patient is 77 years old and had stenting of  his right coronary artery in 2001.  He subsequently had cutting balloon  angioplasty for in-stent restenosis and developed thrombocytopenia on  Integrilin.  He had a follow-up scan six months ago which showed inferior  ischemia and his catheterization showed borderline restenosis within the  stent at about 60% and he underwent repeat cutting balloon angioplasty.  His  follow-up scan recently again showed ischemia and we brought him in for  reevaluation.  He has had no chest pain.   PROCEDURE:  The procedure was performed via the right femoral artery using  arterial sheath and 6 French preformed coronary catheters.  Front wall  arterial puncture was performed and Omnipaque contrast was used.  We never  attempted to minimize contrast.  His baseline creatinine was 2.2 to 2.3 and  we used a total of 70 cc of contrast.  Only three injections were made of  the left coronary artery.  The patient tolerated the procedure well and left  the laboratory in satisfactory condition.   RESULTS:  1. The left main coronary artery was free of significant disease.  2. The left anterior descending artery gave rise to two large septal     perforators and a diagonal branch.  The LAD was calcified in its proximal     portion and there were tandem 30% proximal stenoses.  3. The circumflex artery gave rise to a large intermediate branch and AV     branch was terminating into two posterolateral branches.  These vessels     were free of  significant disease.  4. The right coronary artery was a fairly large vessel with a Shepherd's     crook in the proximal portion.  The right coronary artery gave rise to     two right ventricular branches, a posterior descending, and two     posterolateral branches.  There was 30% narrowing at the stent site in     the proximal to mid right coronary artery which was focal narrowing.     There was 95% stenosis in a small right ventricular branch which arose     from the site of the stent.  No left ventriculogram was performed.   PRESSURES:  Aortic pressure was 149/71 with a mean of 104, left ventricular  pressure was 149/25.   CONCLUSION:  1. Coronary artery disease status post multiple percutaneous interventions     as described above with tandem 30%  stenoses in the proximal LAD, no major     obstruction in the circumflex artery, and 30% narrowing at the stent site     in the proximal to mid right coronary artery.  2. History of previous thrombocytopenia due to Integrilin.    RECOMMENDATIONS:  The patient has no evidence of restenosis.  His scan was  difficult to interpret because of some scarring of the inferior wall.  In  view of these findings, I do not think the scan has any significance.  Will  plan continued medical treatment.  We will see him back for a groin check in  about a week and I will see him back in follow-up in a year.                                               Bruce Alfonso Patten Olevia Perches, M.D. [facility]    BRB/MEDQ  D:  11/07/2001  T:  11/08/2001  Job:  [id number]   cc:   Judithe Modest, M.D.

## 2010-07-24 NOTE — Cardiovascular Report (Signed)
[place]  Patient:    Jose Atkins, Jose Atkins                MRN: [id number] Proc. Date: 02/25/00 Adm. Date:  [id number] Attending:  Fatima Sanger CC:         Varney Baas, M.D.  Cardiopulmonary Laboratory   Cardiac Catheterization  PROCEDURES PERFORMED:  Cardiac catheterization and percutaneous intervention.  CLINICAL HISTORY:  Mr. Joylene Grapes is 77 years old and recently had symptoms of palpitations and chest pain.  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  He had a Cardiolite scan which showed inferior ischemia and a Holter monitor which showed runs of atrial fibrillation versus multiple episodes of atrial tachycardia.  He was started on Toprol and scheduled for evaluation with catheterization.  He also had an echocardiogram which showed a hypotropic left ventricle.  DESCRIPTION OF PROCEDURE:  The procedure was performed via the right femoral artery using an arterial sheath and 6 French preformed coronary catheters.  A front wall arterial puncture was performed and Omnipaque contrast was used. At the completion of the diagnostic study, we made a decision to proceed with intervention of the right coronary artery.  The patient was given weight-adjusted heparin to prolong an ACT of greater than 200 seconds and was given double bolus of Integrilin infusion.  We tried multiple guiding catheters and finally used a shepherds crook, 7 [place] guiding catheter.  We used a Graphix PT wire.  This was used after trying several other wires.  There was a marked band in the proximal and mid vessel which made access difficult.  We initially predilated with a 3.25 x 20 mm Quantum Ranger.  This caused a small dissection.  We then tried to access with a 20 mm x 3.5 radius, but were unable to advance the stent to the lesion because of marked tortuosity.  We next used a 12 x 3.5 mm AVE, and with difficulty we were  able to advance this to the distal portion of the lesion, and we deployed this with one inflation of 9 atmospheres for 30 seconds.  We then placed a second 3.0 x 12 mm AVE overlapping the first and deployed this with one inflation at 12 atmospheres for 30 seconds.  We then went back in with a 3.25 Quantum Ranger and performed one inflation up to 16 atmospheres for 30 seconds covering everything except for the distal edge of the distal stent.  Repeat diagnostic studies were then performed through the guiding catheter.  The patient tolerated the procedure well and left the laboratory in satisfactory condition.  RESULTS: The left main coronary artery:  The left main coronary artery was free of significant disease.  Left anterior descending: The left anterior descending artery gave rise to two septal perforators in the diagonal branch.  These and the LAD were free of significant disease.  Circumflex artery: The circumflex artery gave rise to an intermediate branch and AV branch.  This vessel was free of significant disease.  Right coronary artery: The right coronary was a moderate sized vessel that gave rise to a posterior descending and a posterolateral branch.  There were tandem 80% stenoses in the midportion of the vessel located before and after a bend.  There was also a pronounced shepherds crook in the proximal portion of the vessel.  LEFT VENTRICULOGRAPHY: The left ventriculogram was performed in the RAO projection showed good wall motion with no areas of hypokinesis.  The estimated ejection fraction was greater than 70%.  Following stenting of the tandem lesions in the mid right coronary artery, the stenoses improved from 80% to 0%.  There was no dissection seen.  CONCLUSIONS: 1. Coronary artery disease with no major obstruction in the left anterior    descending and circumflex arteries, and tandem 80% stenoses in the mid    right coronary artery with good left ventricular  function. 2. Successful placement of tandem overlying stents in the mid right    coronary artery and improvement in percent diameter narrowing from    80% to 0%.  DISPOSITION:  The patient was returned to the postangioplasty unit for further observation.  We will plan to discharge the patient on Toprol and hopefully the Toprol will control his arrhythmia and the intervention will provide symptomatic relief of his angina.  If he has breakthrough atrial fibrillation, then we may need to consider amiodarone. DD:  02/25/00 TD:  02/26/00 Job: [id number]

## 2010-07-24 NOTE — H&P (Signed)
[place]. [facility]  Patient:    Jose Atkins, Jose Atkins Visit Number: [id number] MRN: [id number]          Service Type: CAT Location: [place] 01 Attending Physician:  Fatima Sanger Dictated by:   Davis Gourd, P.A. Admit Date:  04/27/2001 Discharge Date: 04/28/2001                           History and Physical  DATE OF BIRTH: March 31, 1923  CHIEF COMPLAINT: Chest pain.  HISTORY OF PRESENT ILLNESS: Mr. Cregar is a 77 year old male, with a known history of coronary artery disease, who was seen in the office on March 29, 2001 for follow-up and management of his blood pressure.  Because of his history of coronary artery disease and high risk of restenosis, he was scheduled for a stress Cardiolite.  The stress Cardiolite was performed on April 11, 2001 and he exercised for a duration of four minutes and 15 seconds on the Bruce protocol, and reached 95% of his predicted maximum heart rate.  He had a good hypertensive response to exercise and no chest pain. However, on the Cardiolite images there was mild inferobasilar ischemic and an EF of 58%.  Because of the ischemia on the Cardiolite, the patient is scheduled to undergo cardiac catheterization on April 27, 2001.  PAST MEDICAL HISTORY:  1. Mild renal insufficiency.  2. Hypertrophic cardiomyopathy.  3. Hypertension.  4. History of carotid artery occlusion on the right.  5. History of allergy to Purcell with profound thrombocytopenia.  6. Thrombocytopenia, on Integrelin.  7. PTCA and stent to the RCA in December 2001.  8. Status post cutting balloon angioplasty for in-stent restenosis in the RCA     in May 2002.  9. Preserved left ventricular function with an EF of 58% by Cardiolite in     February 2003. 10. Non-Q wave myocardial infarction in May 2002. 11. Anemia.  PAST SURGICAL HISTORY: Cardiac catheterization x2.  FAMILY HISTORY: Noncontributory.  SOCIAL HISTORY: The patient has  a remote history of tobacco use but does not currently smoke.  He is married.  He is retired.  ALLERGIES:  1. Thrombocytopenia secondary to INTEGRELIN.  2. Possibly TEQUIN.  MEDICATIONS:  1. Alphagan one drop OU t.i.d..  2. Xalatan one drop OU q.h.s.  3. Centrum vitamin q.d.  4. Enteric-coated aspirin 325 mg q.d.  5. Toprol XL 50 mg q.d.  6. Azopt one drop OU t.i.d.  7. Plavix 75 mg q.d.  8. Diovan/HCTZ 160/25 mg q.d.  9. Norvasc 5 mg q.d. 10. Cimetidine 200 mg q.d. 11. Norvasc 10 mg q.d.  REVIEW OF SYSTEMS: Negative for chest pain, negative for shortness of breath and palpitations.  The patient denies hematemesis, hemoptysis.  He has no recent fever or chills.  He has no history of thyroid disease.  Review Of Systems is otherwise negative.  PHYSICAL EXAMINATION:  VITAL SIGNS: He is afebrile.  His blood pressure is 120/78, with a heart rate of 62 and respiratory rate of 18 and unlabored.  GENERAL: The patient is well-developed, well-nourished.  HEENT: Head normocephalic, atraumatic.  EOMI.  NECK: No JVD, no bruits.  CHEST: Clear to auscultation bilaterally.  CV: Regular rate and rhythm without significant murmurs or gallops.  ABDOMEN: Active bowel sounds.  Soft, nontender.  EXTREMITIES: Negative edema, with distal pulses intact.  NEUROLOGIC: Alert and oriented without focal deficits.  SKIN: No significant lesions noted.  LABORATORY DATA: ECG -  sinus bradycardia with first degree AV block, heart rate 54 beats per minute; PR interval of 0.212.  ASSESSMENT/PLAN:  1. Coronary artery disease, with abnormal Cardiolite.  The patient is     scheduled for cardiac catheterization today and percutaneous intervention     on a p.r.n. basis.  2. The patient is otherwise stable and current medications will be     continued. Dictated by:   Davis Gourd, P.A. Attending Physician:  Fatima Sanger DD:  04/27/01 TD:  04/28/01 Job: [id number]

## 2010-07-24 NOTE — Discharge Summary (Signed)
[place]. [facility]  Patient:    Jose Atkins, Jose Atkins Visit Number: [id number] MRN: [id number]          Service Type: CAT Location: [id number] Attending Physician:  Fatima Sanger Dictated by:   Arn Medal, P.A.-C. Admit Date:  04/27/2001 Discharge Date: 04/28/2001   CC:         Varney Baas, M.D.  Bruce Alfonso Patten Olevia Perches, M.D. [facility]   Discharge Summary  DISCHARGE DIAGNOSES: 1. Chest pain, status post cardiac catheterization. 2. Coronary artery disease. 3. Hypertension. 4. Hypotrophic cardiomyopathy. 5. History of carotid artery occlusion on the left. 6. Mild renal insufficiency. 7. History of thrombocytopenia secondary to Tequin.  HOSPITAL COURSE:  [name] is a 77 year old male who was seen in the office on March 29, 2001 by Dr. Vanna Scotland. Brodie.  Since it had been six months since his last intervention, Dr. Vanna Scotland. Brodie planned for a stress Cardiolite to assess for any restenosis.  This study was performed on April 11, 2001 and showed mild inferobasilar ischemia with an ejection fraction of 68%.  As a result, the patient was scheduled for an outpatient cardiac catheterization.  On April 27, 2001, the patient was taken to the catheterization laboratory by Dr. Vanna Scotland. Brodie.  Catheterization results:  Left anterior descending coronary artery:  A 40% lesion in the proximal as well as in the mid-vessel; circumflex artery:  Irregularities;  Right coronary artery:  There was a 60% in-stent restenosis followed by a 40% distal stenosis.  There was no left ventriculogram performed in the list.  Dr. Vanna Scotland. Brodie then proceeded to use a cutting balloon to reduce the lesion of the in-stent from 60% down to less than 10%.  The patient tolerated the procedure well and was transferred to the postangioplasty unit.  The next day, the patient was doing well.  His groin was stable.  Dr. Vanna Scotland. Olevia Perches felt he was ready to go  home.  DISCHARGE MEDICATIONS: 1. Enteric-coated aspirin 325 mg p.o. q.d. 2. Norvasc 10 mg p.o. q.d. 3. Cimetidine 200 mg p.o. q.d. 4. Toprol XL 50 mg q.d. 5. Plavix 75 mg p.o. q.d. 6. Diovan/HCT 160/25 mg p.o. q.d. 7. Azopt, Xalatan, Alphagan eye drops as previously taken.  LABORATORY DATA:  White count 13.4, hemoglobin 12.6, hematocrit 36.9, platelets 236,000.  Sodium 340, potassium 3.2 (repleted), chloride 108, CO2 28, BUN 18, creatinine 1.4, glucose 106.  Urinalysis is negative.  Electrocardiogram showed sinus rhythm with a first degree AV block, PR interval 224, QRS 82, QTC 446, axis -4.  DISCHARGE INSTRUCTIONS:  The patient is to avoid driving, heavy lifting, or tub baths for two days.  He is to follow a low fat, low cholesterol diet.  He is to watch the catheter site for any pain, bleeding, or swelling and call the [place] office for any of these problems.  He is to follow up with Dr. Varney Baas as needed or scheduled.  He is to follow up with Dr. Vanna Scotland. Brodie in the office on May 18, 2001 at 11:30 in the morning. Dictated by:   Arn Medal, P.A.-C. Attending Physician:  Fatima Sanger DD:  04/28/01 TD:  04/29/01 Job: [id number]

## 2010-07-24 NOTE — Cardiovascular Report (Signed)
[place]. [facility]  Patient:    Jose Atkins, Jose Atkins                MRN: [id number] Proc. Date: 07/08/00 Adm. Date:  [id number] Attending:  Fatima Sanger CC:         Varney Baas, M.D.  Cardiopulmonary Lab   Cardiac Catheterization  CLINICAL HISTORY:  Mr. Learn is 77 years old and had a stent placed in the right coronary artery about five months ago.  He was admitted with chest pain and a non-Q-wave myocardial infarction.  He has a borderline creatinine.  His procedure was postponed today, and then he developed thrombocytopenia which we think was related to Integrilin.  This cause postponement of the procedure another day, but his platelets came up from 40,000 to 90,000, and we proceeded with the procedure today.  PROCEDURE: 1. Cardiac catheterization. 2. Percutaneous coronary intervention.  CARDIOLOGISTVanna Scotland Olevia Perches, M.D. [facility]  PROCEDURE IN DETAIL:  The procedure was performed via the right femoral artery using arterial sheath and 6-French preformed coronary catheters.  A frontal arterial puncture was performed, and Omnipaque contrast was used.  At the completion of the diagnostic study, we made the decision to proceed with intervention of the right coronary artery.  The patient was given weight-adjusted to prolong the ACT to greater than 250 seconds.  We did not give Integrilin because of the recent thrombocytopenia.  We used a 3.5, 7-French shepherds crook guiding catheter and a Graphics PT wire.  We crossed the lesion without difficulty.  We initially predilated with a 2.5 x 30 mm Maverick and performed two inflations up to 10 atmospheres for 42 seconds.  We then went in with a 3.25 x 10 mm cutting balloon.  We had difficulty accessing with stents previously, but we were able to get the short cutting balloon around the bend without too much difficulty.  We performed a total of eight inflations up to 12 atmospheres for 40 seconds  within the two overlapping stents and at both edges.  We then decided to post dilate with a 3.25 x 20 mm Quantum Ranger and performed two inflations at 10 and 12 atmospheres for 38 and 45 seconds.  There was a 70% lesion distal to the stent, and we dilated this with three inflations up to 4 atmospheres for 47 seconds.  Following dilatation with the Quantum Ranger, the lesion within the stent did not look as pristine, so we went back in with a cutting balloon and performed four more additional inflations up to 10 atmospheres for 44 seconds.  Repeat diagnostic study was then performed through the guiding catheter.  The patient tolerated the procedure well and left the laboratory in satisfactory condition.  RESULTS:  Left main coronary artery was free of significant disease.  Left anterior descending artery gave rise to three sets of perforators and a diagonal branch.  There was 40% proximal and 30 and 40% mid stenosis in the LAD with moderate calcification.  The left circumflex artery gave rise to a large intermediate branch, a A-V branch which terminated in a small posterolateral branch.   There was 40% narrowing in the intermediate branch.  The right coronary artery was a moderate size vessel that gave rise to a right ventricular branch, a posterior descending branch, and a posterolateral branch.  There was 95% diffuse in-stent restenosis in the mid proximal right coronary artery.  There was 70% narrowing in the mid right coronary artery, and 50% narrowing  in the distal right coronary artery just before the posterior descending branch.  The left ventriculogram performed in the RAO projection showed akinesis of the inferobasilar wall.  The overall wall motion was good with an estimated ejection fraction of 60%.  Aortic pressure 126/55, mean of 86.  Left ventricular pressure 126/20.  Following PTCA for the in-stent restenosis, this stenosis improved from 95% to 20%.  Following  PTCA for the mid lesion, the stenosis improved from 70% to 20%.  CONCLUSION: 1. Coronary artery disease status post previous stenting of the right coronary    artery on February 25, 2000, with 95% diffuse restenosis in the proximal    vessel, 70% narrowing in the mid vessel, 40% proximal and 40% mid stenosis    in the left anterior descending artery, 40% narrowing in an intermediate    branch of the circumflex artery and inferobasilar wall akinesis. 2. Successful cutting balloon angioplasty for in-stent restenosis in the    proximal right coronary artery with improvement in stent narrowing from    95% to 20% and successful percutaneous transluminal coronary angioplasty    of a mid right coronary artery stenosis with improvement in stent    narrowing from 70% to 20%.  DISPOSITION:  The patient was admitted for further observation.  We will need to follow his creatinine, hemoglobin, and platelets closely and probably keep him at least an extra day. DD:  07/08/00 TD:  07/09/00 Job: [id number]

## 2010-07-24 NOTE — Consult Note (Signed)
NAME:  Jose Atkins, Jose Atkins                        ACCOUNT NO.:  [id number]   MEDICAL RECORD NO.:  [id number]                   PATIENT TYPE:  OIB   LOCATION:  [id number]                                 FACILITY:  [place]   PHYSICIAN:  Gatha Mayer, M.D. [facility]           DATE OF BIRTH:  Nov 02, 1923   DATE OF CONSULTATION:  12/04/2002  DATE OF DISCHARGE:                                   CONSULTATION   REFERRING PHYSICIAN:  Sanjeev K. Estanislado Pandy, M.D.   CONSULTING PHYSICIAN:  Gatha Mayer, M.D. [facility]   PRIMARY CARE PHYSICIAN:  Judithe Modest, M.D.   REASON FOR CONSULTATION:  Coffee ground emesis.   HISTORY:  This is a 77 year old white man who was in the hospital after a  vertebroplasty procedure.  He had two episodes of coffee ground emesis this  morning with some abdominal bloating.  He had really not had problems like  that.  He has been on Nexium for about six week because of failed therapy  with Tagamet for heartburn and reflux and has a history of esophageal  manometry and esophageal dilation which we do not have the records of.  He  is having recurrent solid food dysphagia about two to three times a month  with regurgitation of food at this time.  He has not really lost any weight  that we can tell.  No prior colonoscopy.   PAST MEDICAL HISTORY:  1. Coronary artery disease with stent to the right coronary artery, 2001.  2. Previous angioplasty as well with myocardial infarction.  3. Renal insufficiency.  4. Hypertension.  5. Bilateral carotid occlusion.  6. Supraventricular tachycardia.  7. Anemia.  8. Hypertropic cardiomyopathy.  9. Hiatal hernia.  10.      TUNA (transurethral needle ablation) procedure for prostate     hypertrophy.  11.      Osteoporosis with compression fractures.  12.      Glaucoma with laser therapy.   FAMILY HISTORY:  No colon cancer.   SOCIAL HISTORY:  The patient lives in [place] with his wife.  Retired Government social research officer.  He is a Sports administrator.  No  alcohol or tobacco at [facility] time.   MEDICATIONS:  Norvasc, Toprol XL, Avapro, Lasix, Protonix, Plavix, aspirin,  Trusopt, Alphagan and Xalatan ophthalmologic drops.   ALLERGIES:  INTEGRILIN has caused thrombocytopenia.   REVIEW OF SYSTEMS:  He has had some pain from his vertebroplasty in the back  but overall that went well.  Glaucoma is worsening.  All other systems  appear negative.   PHYSICAL EXAMINATION:  GENERAL:  No acute distress.  Somewhat chronic ill-  appearing elderly white man.  VITAL SIGNS:  Blood pressure 148/86, pulse 58, respirations 20, temperature  98.8.  HEENT:  Eyes anicteric.  Mouth reveals lesions.  NECK:  Supple.  CHEST:  Clear.  HEART:  S1,S2.  No rubs or gallops.  ABDOMEN:  Soft, nontender.  No organomegaly or masses.  Bowel sounds  present.  SKIN:  Sun damaged.  No acute rash.  NEURO:  He is alert and oriented x3.  EXTREMITIES:  No edema.   LABORATORY DATA:  Pro time and PTT are normal.  Hemoglobin 13.9, hematocrit  41.  BMET normal except for a creatinine of 1.9.  LFTs normal.   ASSESSMENT:  Coffee ground emesis, uncertain etiology, with a history of  dysphagia, hiatal hernia.  Prior esophageal dilation though history is that  he did not have an obvious stricture at that time.   RECOMMENDATIONS AND PLAN:  EGD to assess.  He has resumed his Plavix which  was held, so we will not dilate today.  Further plans pending the above.   I appreciate the opportunity to care for this patient.                                               Gatha Mayer, M.D. [facility]    CEG/MEDQ  D:  12/04/2002  T:  12/04/2002  Job:  [id number]   cc:   Sanjeev K. Estanislado Pandy, M.D.  [address]  [place]  [place]  [address]  Fax: [phone]   Judithe Modest, M.D.  [address]  [place]  [place] [address]  Fax: [phone]

## 2010-07-24 NOTE — Discharge Summary (Signed)
[place]. [facility]  Patient:    Jose Atkins, Jose Atkins                MRN: [id number] Adm. Date:  [id number] Disc. Date: [id number] Attending:  Fatima Sanger Dictator:   Arn Medal, P.A.C. CC:         Varney Baas, M.D.  Marshall Cork. Jeffie Pollock, M.D.  Bruce Alfonso Patten Olevia Perches, M.D. [facility]   Discharge Summary  DISCHARGE DIAGNOSES: 1. Coronary artery disease, status post angioplasty for in-stent restenosis. 2. Hypertension. 3. History of supraventricular tachycardia. 4. Atherosclerotic peripheral vascular disease, totally occluded left internal    carotid artery as well as 80% stenosis in the right vertebral artery.  HOSPITAL COURSE:  The patient presented to the emergency room on April 30, transferred from [facility] for further evaluation of chest pain.  His chest pain had started the day before and consisting of three separate episodes, each relieved by sublingual nitroglycerin.  On the day of admission, the chest tightness persisted and he went to [facility].  It was noted there on IV nitroglycerin that his blood pressure dropped to 69/49, so it was discontinued.  Of note, the patient states that this pain is identical to his preintervention pain from December of 2001.  He was seen and admitted by Collier Salina C. Johnsie Cancel, M.D. [facility]  Dr. Johnsie Cancel also noted that the patients renal functions were elevated at 26 and 2.6.  He held the patients diuretics and ACE inhibitors.  Later that evening, the patient had an episode of sudden severe chest tightness and radiated to the right arm.  He received two sublingual nitroglycerins which dropped his blood pressure into the 60s.  This patient felt weak.  The patient was placed in Trendelenburg position and IV fluids were administered.  As his blood pressure recovered, a nitroglycerin drip was started at a low level.  He was also treated with IV morphine.  He was seen by Dr. Lia Foyer who held [facility] administration due to  the patients increased creatinine.  He started the patient on Plavix since he suspected the patient had only single vessel disease.  The following day, the patient reported his chest pain as markedly improved. It was noted that his renal functions improved somewhat to 23 and 2.1.  Dr. Olevia Perches saw the patient and ordered continued hydration and started administration of Mucomist that day in anticipation of cardiac catheterization the following day.  He felt that if the patients pain returned that day, then he would be taken to the lab urgently.  The following day, the patient was seen by Denice Bors. Stanford Breed, M.D. [facility]  Dr. Stanford Breed noted that the patient became rather anemic and thrombocytopenic since his admission.   Before starting Integrilin, the patients platelet count was 211, that day it was 46.  On admission the patients hemoglobin was 13.1 with hematocrit of 41.  On that day it was 9.5 and 27.7.  Integrilin was stopped and plans for catheterization were put on hold.  Dr. Stanford Breed also noticed that with hydration and Mucomist, the patients renal function had improved quite well at 10 and 1.8.  Repeat CBC later that morning noted that the patients platelets were now 75.  Dr. Stanford Breed felt comfortable continuing the patient on aspirin, heparin, and Plavix.  The following day, the patient was taken to the catheterization lab by Bruce R. Olevia Perches, M.D. [facility]  Catheterization revealed the following; In the left anterior descending artery, there was a 40% proximal lesion as well as  the 30 and 40% midvessel lesions.  In the intermediate portion of the circumflex artery, there was a 40% stenosis.  In the right coronary artery there was noted to be a 95% in-stent stenosis.  This was subsequently angioplastied from 95 down to less than 20%.  The patient was also noted to have inferior basal akinesis with an ejection fraction of approximately 60%.  The following day, the patient was seen by Dr.  Lia Foyer.  The patient denied further chest pain and felt much better since the intervention with previous day.  He also, however, complained of some right shoulder pain which radiated down his back.  Dr. Lia Foyer ordered administration of morphine for this.  He noted that the patients echocardiogram which was done the previous day suggested normal LV and RV function, normal right atrium and RV sizes.  He stopped the patients Plavix since no stent had been placed and platelets were decreased.  He noted that the patients hemoglobin was now 10.7 with hematocrit of 30.7 and a platelet count of 108.  The next day, the patient was seen by Allene Dillon, M.D. [facility]  The patient reported some fever and chills and cough which is productive of whitish sputum.  However, he denied chest pain or dyspnea.  Dr. Vicenta Aly noted that the patients temperature had peaked at 101.5 and is currently 97.2.  On examination his lung sounds were slightly decreased at the bases.  His white count was 12.9 with a left shift in progress.  Therefore, we empirically started the patient on p.o. Tequin.  He also noted the patients renal functions were relatively stable at 14 and 1.8.  On May 6, the patient was again seen by Dr. Vicenta Aly.  The patient was ambulating well and had no further chest pain.  He also noted that the patients TMAX during the prior 24 hours was 101.7.  He also noted that his platelets had recovered quite well to 186.  Renal function was 24 and 1.8.  The following day, the patient was seen by Dr. Olevia Perches.  The patient was feeling well and had no further chest pain.  Renal function was 26 and 1.8. Dr. Olevia Perches felt that the patient was stable for discharge.  He felt he was going to continue the patient on Plavix due to the risk of in-stent restenosis. He also planned an early follow-up angiogram within two to three months because of the high risk of restenosis.  He planned to consider use  of  brachiotherapy at that time even though he considered the artery rather tortuous.  DISCHARGE MEDICATIONS: 1. Altace 5 mg q.d. 2. Toprol XL 50 mg q.d. 3. Triamterene HCTZ 75/50 one q.d. 4. Enteric-coated aspirin 325 mg q.d. 5. Various eyedrops as previously taken. 6. Plavix 75 mg q.d. for four weeks. 7. Tequin 400 mg q.d. for seven days.  DISCHARGE INSTRUCTIONS:  The patient is advised to return to his normal level of activity.  He was advised to follow a low fat, low cholesterol diet.  He was advised to stop by the [facility] and pick up Tequin samples from the first floor desk and Plavix samples from the second floor desk.  He is to follow up with Dr. Denton Lank office on Monday to verify that they have received his discharge summary.  He is to follow up with Dr. Debria Garret as needed or scheduled.  He is to follow up with Dr. Olevia Perches on May 23, at 11:30 a.m.  LABORATORY DATA:  Blood, urine, and respiratory  cultures were all negative. Peak cardiac enzymes were on May 1, CK 405, MB 72.0, index 17.8 with troponin I of 2.01.  Renal ultrasound on April 2, revealed multiple right renal cysts which were felt to be simple in nature.  There was no evidence for hydronephrosis or solid renal mass.  There was also noted to be no ascites.  Electrocardiogram revealed normal sinus rhythm with borderline first degree AV block.  There were also some nonspecific ST and T wave abnormalities.  There was also evidence of an inferior myocardial infarction.  PR interval was 0.208, QRS 0.078, QTC 0.405, axis of -17. DD:  07/12/00 TD:  07/12/00 Job: [id number]

## 2010-07-29 ENCOUNTER — Encounter (HOSPITAL_BASED_OUTPATIENT_CLINIC_OR_DEPARTMENT_OTHER): Payer: Medicare Other | Admitting: Oncology

## 2010-07-29 ENCOUNTER — Other Ambulatory Visit: Payer: Self-pay | Admitting: Oncology

## 2010-07-29 DIAGNOSIS — I2510 Atherosclerotic heart disease of native coronary artery without angina pectoris: Secondary | ICD-10-CM

## 2010-07-29 DIAGNOSIS — D72829 Elevated white blood cell count, unspecified: Secondary | ICD-10-CM

## 2010-07-29 DIAGNOSIS — D649 Anemia, unspecified: Secondary | ICD-10-CM

## 2010-07-29 DIAGNOSIS — N189 Chronic kidney disease, unspecified: Secondary | ICD-10-CM

## 2010-07-29 LAB — CBC WITH DIFFERENTIAL/PLATELET
Basophils Absolute: 0 10*3/uL (ref 0.0–0.1)
Eosinophils Absolute: 0.6 10*3/uL — ABNORMAL HIGH (ref 0.0–0.5)
HCT: 29.5 % — ABNORMAL LOW (ref 38.4–49.9)
HGB: 9.9 g/dL — ABNORMAL LOW (ref 13.0–17.1)
MONO#: 1 10*3/uL — ABNORMAL HIGH (ref 0.1–0.9)
NEUT#: 11.6 10*3/uL — ABNORMAL HIGH (ref 1.5–6.5)
NEUT%: 79.3 % — ABNORMAL HIGH (ref 39.0–75.0)
RDW: 17.1 % — ABNORMAL HIGH (ref 11.0–14.6)
WBC: 14.7 10*3/uL — ABNORMAL HIGH (ref 4.0–10.3)
lymph#: 1.4 10*3/uL (ref 0.9–3.3)

## 2010-11-04 ENCOUNTER — Other Ambulatory Visit: Payer: Self-pay | Admitting: Oncology

## 2010-11-04 ENCOUNTER — Encounter (HOSPITAL_BASED_OUTPATIENT_CLINIC_OR_DEPARTMENT_OTHER): Payer: Medicare Other | Admitting: Oncology

## 2010-11-04 DIAGNOSIS — D72829 Elevated white blood cell count, unspecified: Secondary | ICD-10-CM

## 2010-11-04 DIAGNOSIS — I2510 Atherosclerotic heart disease of native coronary artery without angina pectoris: Secondary | ICD-10-CM

## 2010-11-04 DIAGNOSIS — D649 Anemia, unspecified: Secondary | ICD-10-CM

## 2010-11-04 DIAGNOSIS — N189 Chronic kidney disease, unspecified: Secondary | ICD-10-CM

## 2010-11-04 LAB — CBC WITH DIFFERENTIAL/PLATELET
Basophils Absolute: 0 10*3/uL (ref 0.0–0.1)
EOS%: 1.8 % (ref 0.0–7.0)
Eosinophils Absolute: 0.3 10*3/uL (ref 0.0–0.5)
HCT: 30.8 % — ABNORMAL LOW (ref 38.4–49.9)
HGB: 10.4 g/dL — ABNORMAL LOW (ref 13.0–17.1)
MCH: 29.4 pg (ref 27.2–33.4)
MCV: 87.1 fL (ref 79.3–98.0)
MONO%: 6.8 % (ref 0.0–14.0)
NEUT#: 12.3 10*3/uL — ABNORMAL HIGH (ref 1.5–6.5)
NEUT%: 80.1 % — ABNORMAL HIGH (ref 39.0–75.0)
RDW: 17 % — ABNORMAL HIGH (ref 11.0–14.6)

## 2010-11-04 LAB — COMPREHENSIVE METABOLIC PANEL
Albumin: 3.7 g/dL (ref 3.5–5.2)
Alkaline Phosphatase: 91 U/L (ref 39–117)
BUN: 15 mg/dL (ref 6–23)
CO2: 24 mEq/L (ref 19–32)
Glucose, Bld: 147 mg/dL — ABNORMAL HIGH (ref 70–99)
Potassium: 3.8 mEq/L (ref 3.5–5.3)
Sodium: 136 mEq/L (ref 135–145)
Total Bilirubin: 0.3 mg/dL (ref 0.3–1.2)
Total Protein: 6.6 g/dL (ref 6.0–8.3)

## 2010-11-11 LAB — BCR/ABL (LIO MMD)

## 2010-11-23 LAB — JAK-2 V617F

## 2011-01-08 ENCOUNTER — Encounter: Payer: Self-pay | Admitting: Oncology

## 2011-01-08 ENCOUNTER — Other Ambulatory Visit: Payer: Self-pay | Admitting: Oncology

## 2011-01-08 DIAGNOSIS — E785 Hyperlipidemia, unspecified: Secondary | ICD-10-CM | POA: Insufficient documentation

## 2011-01-08 DIAGNOSIS — K219 Gastro-esophageal reflux disease without esophagitis: Secondary | ICD-10-CM | POA: Insufficient documentation

## 2011-01-08 DIAGNOSIS — N189 Chronic kidney disease, unspecified: Secondary | ICD-10-CM | POA: Insufficient documentation

## 2011-01-08 DIAGNOSIS — N400 Enlarged prostate without lower urinary tract symptoms: Secondary | ICD-10-CM | POA: Insufficient documentation

## 2011-01-08 DIAGNOSIS — D72829 Elevated white blood cell count, unspecified: Secondary | ICD-10-CM

## 2011-01-08 DIAGNOSIS — D631 Anemia in chronic kidney disease: Secondary | ICD-10-CM

## 2011-01-20 ENCOUNTER — Other Ambulatory Visit: Payer: Self-pay | Admitting: Oncology

## 2011-01-20 ENCOUNTER — Telehealth: Payer: Self-pay | Admitting: Oncology

## 2011-01-20 ENCOUNTER — Other Ambulatory Visit (HOSPITAL_BASED_OUTPATIENT_CLINIC_OR_DEPARTMENT_OTHER): Payer: Medicare Other | Admitting: Lab

## 2011-01-20 DIAGNOSIS — D72829 Elevated white blood cell count, unspecified: Secondary | ICD-10-CM

## 2011-01-20 DIAGNOSIS — I2510 Atherosclerotic heart disease of native coronary artery without angina pectoris: Secondary | ICD-10-CM

## 2011-01-20 DIAGNOSIS — D649 Anemia, unspecified: Secondary | ICD-10-CM

## 2011-01-20 DIAGNOSIS — N189 Chronic kidney disease, unspecified: Secondary | ICD-10-CM

## 2011-01-20 LAB — CBC WITH DIFFERENTIAL/PLATELET
BASO%: 0 % (ref 0.0–2.0)
EOS%: 1.5 % (ref 0.0–7.0)
MCH: 29.7 pg (ref 27.2–33.4)
MCHC: 33.1 g/dL (ref 32.0–36.0)
MONO#: 1 10*3/uL — ABNORMAL HIGH (ref 0.1–0.9)
RDW: 16.5 % — ABNORMAL HIGH (ref 11.0–14.6)
WBC: 15.8 10*3/uL — ABNORMAL HIGH (ref 4.0–10.3)
lymph#: 2.3 10*3/uL (ref 0.9–3.3)

## 2011-01-20 NOTE — Telephone Encounter (Signed)
S/w the pt's wife and they are aware of the feb 2013 appts.

## 2011-01-21 ENCOUNTER — Telehealth: Payer: Self-pay | Admitting: *Deleted

## 2011-01-21 NOTE — Telephone Encounter (Signed)
Message copied by Maudie Mercury on Thu Jan 21, 2011  8:54 AM ------      Message from: HA, Trudee Grip T      Created: Wed Jan 20, 2011  2:46 PM       Please call pt. Please let him know that his white count is still elevated but stable and his anemia is stable. A scheduler will call him to see me sometime in March 2013 crit take you

## 2011-01-21 NOTE — Telephone Encounter (Signed)
Called pt's home and s/w wife, Mickel Baas.  Informed of note below from Dr. Lamonte Sakai.  Explained WBC still elevated but stable and anemia is stable. Informed visit will be scheduled to see Dr. Lamonte Sakai again in March.  She verbalized understanding.

## 2011-04-05 DIAGNOSIS — H353190 Nonexudative age-related macular degeneration, unspecified eye, stage unspecified: Secondary | ICD-10-CM | POA: Diagnosis not present

## 2011-04-12 DIAGNOSIS — D649 Anemia, unspecified: Secondary | ICD-10-CM | POA: Diagnosis not present

## 2011-04-12 DIAGNOSIS — D66 Hereditary factor VIII deficiency: Secondary | ICD-10-CM | POA: Diagnosis not present

## 2011-04-12 DIAGNOSIS — Z79899 Other long term (current) drug therapy: Secondary | ICD-10-CM | POA: Diagnosis not present

## 2011-04-12 DIAGNOSIS — E7800 Pure hypercholesterolemia, unspecified: Secondary | ICD-10-CM | POA: Diagnosis not present

## 2011-04-13 ENCOUNTER — Other Ambulatory Visit: Payer: Self-pay | Admitting: *Deleted

## 2011-04-13 ENCOUNTER — Encounter: Payer: Self-pay | Admitting: *Deleted

## 2011-04-13 ENCOUNTER — Telehealth: Payer: Self-pay | Admitting: Oncology

## 2011-04-13 NOTE — Telephone Encounter (Signed)
pts wife  called and r/s appts for 02/13 to 03/14

## 2011-04-16 DIAGNOSIS — I4891 Unspecified atrial fibrillation: Secondary | ICD-10-CM | POA: Diagnosis not present

## 2011-04-16 DIAGNOSIS — E7800 Pure hypercholesterolemia, unspecified: Secondary | ICD-10-CM | POA: Diagnosis not present

## 2011-04-16 DIAGNOSIS — I10 Essential (primary) hypertension: Secondary | ICD-10-CM | POA: Diagnosis not present

## 2011-04-21 ENCOUNTER — Other Ambulatory Visit: Payer: Medicare Other | Admitting: Lab

## 2011-04-21 ENCOUNTER — Ambulatory Visit: Payer: Medicare Other | Admitting: Oncology

## 2011-04-26 DIAGNOSIS — N3941 Urge incontinence: Secondary | ICD-10-CM | POA: Diagnosis not present

## 2011-04-26 DIAGNOSIS — N401 Enlarged prostate with lower urinary tract symptoms: Secondary | ICD-10-CM | POA: Diagnosis not present

## 2011-05-07 DIAGNOSIS — I2510 Atherosclerotic heart disease of native coronary artery without angina pectoris: Secondary | ICD-10-CM | POA: Diagnosis not present

## 2011-05-07 DIAGNOSIS — E785 Hyperlipidemia, unspecified: Secondary | ICD-10-CM | POA: Diagnosis not present

## 2011-05-07 DIAGNOSIS — I6529 Occlusion and stenosis of unspecified carotid artery: Secondary | ICD-10-CM | POA: Diagnosis not present

## 2011-05-07 DIAGNOSIS — I4891 Unspecified atrial fibrillation: Secondary | ICD-10-CM | POA: Diagnosis not present

## 2011-05-07 DIAGNOSIS — Z7901 Long term (current) use of anticoagulants: Secondary | ICD-10-CM | POA: Diagnosis not present

## 2011-05-07 DIAGNOSIS — I252 Old myocardial infarction: Secondary | ICD-10-CM | POA: Diagnosis not present

## 2011-05-07 DIAGNOSIS — I119 Hypertensive heart disease without heart failure: Secondary | ICD-10-CM | POA: Diagnosis not present

## 2011-05-20 ENCOUNTER — Telehealth: Payer: Self-pay | Admitting: Oncology

## 2011-05-20 ENCOUNTER — Ambulatory Visit (HOSPITAL_COMMUNITY)
Admission: RE | Admit: 2011-05-20 | Discharge: 2011-05-20 | Disposition: A | Discharge status: Home / Self Care | Payer: Medicare Other | Source: Ambulatory Visit | Attending: Oncology | Admitting: Oncology

## 2011-05-20 ENCOUNTER — Encounter: Payer: Self-pay | Admitting: Oncology

## 2011-05-20 ENCOUNTER — Other Ambulatory Visit (HOSPITAL_BASED_OUTPATIENT_CLINIC_OR_DEPARTMENT_OTHER): Payer: Medicare Other | Admitting: Lab

## 2011-05-20 ENCOUNTER — Ambulatory Visit (HOSPITAL_BASED_OUTPATIENT_CLINIC_OR_DEPARTMENT_OTHER): Payer: Medicare Other | Admitting: Oncology

## 2011-05-20 VITALS — BP 162/67 | HR 75 | Temp 97.0°F | Ht 67.0 in | Wt 118.3 lb

## 2011-05-20 DIAGNOSIS — D631 Anemia in chronic kidney disease: Secondary | ICD-10-CM | POA: Insufficient documentation

## 2011-05-20 DIAGNOSIS — F17200 Nicotine dependence, unspecified, uncomplicated: Secondary | ICD-10-CM | POA: Insufficient documentation

## 2011-05-20 DIAGNOSIS — N189 Chronic kidney disease, unspecified: Secondary | ICD-10-CM | POA: Diagnosis not present

## 2011-05-20 DIAGNOSIS — Z87891 Personal history of nicotine dependence: Secondary | ICD-10-CM

## 2011-05-20 DIAGNOSIS — D72829 Elevated white blood cell count, unspecified: Secondary | ICD-10-CM | POA: Insufficient documentation

## 2011-05-20 DIAGNOSIS — D649 Anemia, unspecified: Secondary | ICD-10-CM | POA: Diagnosis not present

## 2011-05-20 DIAGNOSIS — R634 Abnormal weight loss: Secondary | ICD-10-CM | POA: Diagnosis not present

## 2011-05-20 DIAGNOSIS — N039 Chronic nephritic syndrome with unspecified morphologic changes: Secondary | ICD-10-CM | POA: Insufficient documentation

## 2011-05-20 HISTORY — DX: Abnormal weight loss: R63.4

## 2011-05-20 HISTORY — DX: Personal history of nicotine dependence: Z87.891

## 2011-05-20 LAB — COMPREHENSIVE METABOLIC PANEL
ALT: 11 U/L (ref 0–53)
AST: 15 U/L (ref 0–37)
Albumin: 3.6 g/dL (ref 3.5–5.2)
Alkaline Phosphatase: 93 U/L (ref 39–117)
BUN: 19 mg/dL (ref 6–23)
Calcium: 8.9 mg/dL (ref 8.4–10.5)
Chloride: 104 mEq/L (ref 96–112)
Potassium: 4 mEq/L (ref 3.5–5.3)
Sodium: 138 mEq/L (ref 135–145)
Total Protein: 6.3 g/dL (ref 6.0–8.3)

## 2011-05-20 LAB — CBC WITH DIFFERENTIAL/PLATELET
BASO%: 0.2 % (ref 0.0–2.0)
EOS%: 2.6 % (ref 0.0–7.0)
HGB: 11.3 g/dL — ABNORMAL LOW (ref 13.0–17.1)
MCH: 29.6 pg (ref 27.2–33.4)
MCHC: 32.9 g/dL (ref 32.0–36.0)
MCV: 90 fL (ref 79.3–98.0)
MONO%: 6 % (ref 0.0–14.0)
RBC: 3.81 10*6/uL — ABNORMAL LOW (ref 4.20–5.82)
RDW: 15.5 % — ABNORMAL HIGH (ref 11.0–14.6)
lymph#: 1.9 10*3/uL (ref 0.9–3.3)

## 2011-05-20 LAB — MORPHOLOGY

## 2011-05-20 NOTE — Telephone Encounter (Signed)
appts made and printed for pt and pt sent to xray   aom

## 2011-05-20 NOTE — Progress Notes (Signed)
[place] OFFICE PROGRESS NOTE  Cc:  Leonides Sake, MD, MD  DIAGNOSIS: leukocytosis, NOS (JAK-2 mutation negative, BCR-ABL negative), and chronic normocytic anemia, NOS.    CURRENT THERAPY:  Watchful observation.   INTERVAL HISTORY: Jose Atkins 78 y.o. male returns for regular follow up with his wife and daughter.  He reported doing well.  He has good appetite; however, he continues to lose weight.  His work up was negative for thyroid disease and diabetes.  He had history of smoking in the past but quit more than 30 years ago.  He had pneumonia last year; since then, he has not had any cough, CP.    Patient denies fatigue, headache, visual changes, confusion, drenching night sweats, palpable lymph node swelling, mucositis, odynophagia, dysphagia, nausea vomiting, jaundice, chest pain, palpitation, shortness of breath, dyspnea on exertion, productive cough, gum bleeding, epistaxis, hematemesis, hemoptysis, abdominal pain, abdominal swelling, early satiety, melena, hematochezia, hematuria, skin rash, spontaneous bleeding, joint swelling, joint pain, heat or cold intolerance, bowel bladder incontinence, back pain, focal motor weakness, paresthesia, depression, suicidal or homocidal ideation, feeling hopelessness.   Past Medical History  Diagnosis Date  . BPH (benign prostatic hyperplasia)   . Hyperlipemia   . CKD (chronic kidney disease)   . GERD (gastroesophageal reflux disease)   . Anemia associated with chronic renal failure   . Leukocytosis   . Weight loss 05/20/2011  . Smoking history 05/20/2011    No past surgical history on file.  Current Outpatient Prescriptions  Medication Sig Dispense Refill  . aspirin 81 MG tablet Take 81 mg by mouth daily.      . Calcium Carbonate-Vit D-Min (CALCIUM 600+D PLUS MINERALS) 600-400 MG-UNIT TABS Take 1 tablet by mouth 2 (two) times daily.      . digoxin (LANOXIN) 0.125 MG tablet Take 125 mcg by mouth daily.      Marland Kitchen diltiazem  (CARDIZEM CD) 240 MG 24 hr capsule Take 240 mg by mouth 2 (two) times daily.       Marland Kitchen dutasteride (AVODART) 0.5 MG capsule Take 0.5 mg by mouth daily.      Marland Kitchen esomeprazole (NEXIUM) 40 MG capsule Take 40 mg by mouth daily before breakfast.      . furosemide (LASIX) 20 MG tablet Take 20 mg by mouth daily.      . hydrALAZINE (APRESOLINE) 25 MG tablet Take 25 mg by mouth 2 (two) times daily.      Marland Kitchen losartan (COZAAR) 100 MG tablet Take 100 mg by mouth daily.      . Multiple Vitamin (MULTIVITAMIN) tablet Take 1 tablet by mouth daily.      . pravastatin (PRAVACHOL) 40 MG tablet Take 40 mg by mouth daily.      . solifenacin (VESICARE) 5 MG tablet Take 5 mg by mouth daily.        ALLERGIES:   has no known allergies.  REVIEW OF SYSTEMS:  The rest of the 14-point review of system was negative.   Filed Vitals:   05/20/11 1327  BP: 162/67  Pulse: 75  Temp: 97 F (36.1 C)   Wt Readings from Last 3 Encounters:  05/20/11 118 lb 4.8 oz (53.661 kg)  11/04/10 125 lb 3.2 oz (56.79 kg)  03/17/10 132 lb 4 oz (59.988 kg)   ECOG Performance status: 1  PHYSICAL EXAMINATION:   General:  Thin-appearing man in no acute distress.  Eyes:  no scleral icterus.  ENT:  There were no oropharyngeal lesions.  Neck was  without thyromegaly.  Lymphatics:  Negative cervical, supraclavicular or axillary adenopathy.  Respiratory: lungs were clear bilaterally without wheezing or crackles.  Cardiovascular:  Regular rate and rhythm, S1/S2, without murmur, rub or gallop.  There was no pedal edema.  GI:  abdomen was soft, flat, nontender, nondistended, without organomegaly.  Muscoloskeletal:  no spinal tenderness of palpation of vertebral spine.  Skin exam was without echymosis, petichae.  Neuro exam was nonfocal.  Patient was able to get on and off exam table without assistance.  Gait was normal.  Patient was alerted and oriented.  Attention was good.   Language was appropriate.  Mood was normal without depression.  Speech was not  pressured.  Thought content was not tangential.     LABORATORY/RADIOLOGY DATA:  Lab Results  Component Value Date   WBC 12.9* 05/20/2011   HGB 11.3* 05/20/2011   HCT 34.3* 05/20/2011   PLT 378 05/20/2011   GLUCOSE 147* 11/04/2010   CHOL  Value: 112        ATP III CLASSIFICATION:  <200     mg/dL   Desirable  200-239  mg/dL   Borderline High  >=240    mg/dL   High        02/14/2010   TRIG 104 02/14/2010   HDL 34* 02/14/2010   LDLCALC  Value: 57        Total Cholesterol/HDL:CHD Risk Coronary Heart Disease Risk Table                     Men   Women  1/2 Average Risk   3.4   3.3  Average Risk       5.0   4.4  2 X Average Risk   9.6   7.1  3 X Average Risk  23.4   11.0        Use the calculated Patient Ratio above and the CHD Risk Table to determine the patient's CHD Risk.        ATP III CLASSIFICATION (LDL):  <100     mg/dL   Optimal  100-129  mg/dL   Near or Above                    Optimal  130-159  mg/dL   Borderline  160-189  mg/dL   High  >190     mg/dL   Very High 02/14/2010   ALT 12 11/04/2010   AST 16 11/04/2010   NA 136 11/04/2010   K 3.8 11/04/2010   CL 101 11/04/2010   CREATININE 1.54* 11/04/2010   BUN 15 11/04/2010   CO2 24 11/04/2010   TSH 1.331 02/14/2010   INR 1.47 05/03/2010   HGBA1C  Value: 6.3 (NOTE)                                                                       According to the ADA Clinical Practice Recommendations for 2011, when HbA1c is used as a screening test:   >=6.5%   Diagnostic of Diabetes Mellitus           (if abnormal result  is confirmed)  5.7-6.4%   Increased risk of developing Diabetes Mellitus  References:Diagnosis and Classification of Diabetes Mellitus,Diabetes Care,2011,34(Suppl 1):S62-S69  and Standards of Medical Care in         Diabetes - 2011,Diabetes [phone]  (Suppl 1):S11-S61.* 02/14/2010    Dg Chest 2 View  05/20/2011  *RADIOLOGY REPORT*  Clinical Data: Smoker.  Weight loss  CHEST - 2 VIEW  Comparison: 05/06/2010  Findings: There is a tortuous and  unfolded thoracic aorta.  The heart size appears normal.  No pleural effusion or pulmonary edema.  Coarsened interstitial markings are identified in both lungs compatible with COPD.  There is no airspace consolidation.  Chronic right fifth and sixth rib fracture deformities are identified.  There are multilevel thoracic compression deformities. Several of these have been treated with bone cement.  When compared with 05/06/2010 the appearance of the thoracic spine is unchanged.  IMPRESSION:  1.  No active cardiopulmonary abnormalities. 2.  Suspect COPD.  Original Report Authenticated By: Angelita Ingles, M.D.   ASSESSMENT AND PLAN:  1. Chronic anemia dating back as far as 5 years ago, most likely secondary to chronic kidney disease vs ineffective erythropoiesis.  His Hgb is quite stable.  Past work up with SPEP, serum free light chains, vitamin B12, folate and iron panel have all been negative.   2. Leukocytosis, neutrophil predominant.  Work up was negative in the past with normal BCR/ABL and negative JAK-2.  This is most likely reactive. His leukocytosis today is better than before. We'll continue observation.   3. Hypertension.  He is on diltiazem, hydralazine, losartan.  We will defer to PCP. 4. History of coronary artery disease.  He is on aspirin, losartan, pravastatin.  He is not on a beta blocker.  I again defer to PCP and cardiologist. 5. Chronic kidney disease.  Most likely due to chronic HTN.  If it get worse, I defer to PCP to decide when to refer to Nephrology as appropriate.  6. Weigh loss:  Unclear etiology.  His appetite is normal.  Reportedly, he was ruled out for hyperthyroid.  He does not have sign or symptoms of occult malignancy.  I advised him to follow up with PCP re possible DM since his HgbA1C in the past was elevated.   7. Followup. Lab only in 3 and 6 months.  RV with me in about 9 months.       The length of time of the face-to-face encounter was 15  minutes. More than 50%  of time was spent counseling and coordination of care.

## 2011-05-21 ENCOUNTER — Telehealth: Payer: Self-pay | Admitting: *Deleted

## 2011-05-21 NOTE — Telephone Encounter (Signed)
Message copied by Maudie Mercury on Fri May 21, 2011 11:44 AM ------      Message from: HA, Trudee Grip T      Created: Thu May 20, 2011  3:30 PM       Please call pt.  His CXR was negative (I got this to see if there was anything to explain his weight loss).  If he continues to lose weight, he needs to talk with his PCP.  Thanks.

## 2011-05-21 NOTE — Telephone Encounter (Signed)
Left VM for pt to return call so I can relay Dr Agustina Caroli message regarding his CXR and wt loss.

## 2011-05-21 NOTE — Telephone Encounter (Signed)
Wife returned call and I informed her CXR was negative per Dr. Lamonte Sakai and for pt to contact PCP if he continues to lose weight.  She verbalized understanding.

## 2011-08-19 ENCOUNTER — Other Ambulatory Visit: Payer: Medicare Other | Admitting: Lab

## 2011-08-30 DIAGNOSIS — H353190 Nonexudative age-related macular degeneration, unspecified eye, stage unspecified: Secondary | ICD-10-CM | POA: Diagnosis not present

## 2011-10-04 DIAGNOSIS — E7800 Pure hypercholesterolemia, unspecified: Secondary | ICD-10-CM | POA: Diagnosis not present

## 2011-10-04 DIAGNOSIS — Z79899 Other long term (current) drug therapy: Secondary | ICD-10-CM | POA: Diagnosis not present

## 2011-10-04 DIAGNOSIS — D72829 Elevated white blood cell count, unspecified: Secondary | ICD-10-CM | POA: Diagnosis not present

## 2011-10-12 DIAGNOSIS — I10 Essential (primary) hypertension: Secondary | ICD-10-CM | POA: Diagnosis not present

## 2011-10-12 DIAGNOSIS — I4891 Unspecified atrial fibrillation: Secondary | ICD-10-CM | POA: Diagnosis not present

## 2011-10-12 DIAGNOSIS — R7309 Other abnormal glucose: Secondary | ICD-10-CM | POA: Diagnosis not present

## 2011-11-01 DIAGNOSIS — I2510 Atherosclerotic heart disease of native coronary artery without angina pectoris: Secondary | ICD-10-CM | POA: Diagnosis not present

## 2011-11-01 DIAGNOSIS — I6529 Occlusion and stenosis of unspecified carotid artery: Secondary | ICD-10-CM | POA: Diagnosis not present

## 2011-11-01 DIAGNOSIS — I119 Hypertensive heart disease without heart failure: Secondary | ICD-10-CM | POA: Diagnosis not present

## 2011-11-01 DIAGNOSIS — I4891 Unspecified atrial fibrillation: Secondary | ICD-10-CM | POA: Diagnosis not present

## 2011-11-01 DIAGNOSIS — E785 Hyperlipidemia, unspecified: Secondary | ICD-10-CM | POA: Diagnosis not present

## 2011-11-01 DIAGNOSIS — I252 Old myocardial infarction: Secondary | ICD-10-CM | POA: Diagnosis not present

## 2011-11-25 ENCOUNTER — Other Ambulatory Visit: Payer: Medicare Other

## 2011-12-27 DIAGNOSIS — H4011X0 Primary open-angle glaucoma, stage unspecified: Secondary | ICD-10-CM | POA: Diagnosis not present

## 2012-02-23 NOTE — Patient Instructions (Addendum)
1.  Issues:  leukocytosis (elevated white blood cell WBC).  Most likely due to chronic inflammation.  Work up in the past were [place]. 2.  Recommendation:  Watchful observation.  Continue blood check twice a year.  Follow up in about 1 year. In the future, if WBC significantly increases or worsening anemia, we may consider bone marrow biopsy.

## 2012-02-24 ENCOUNTER — Other Ambulatory Visit (HOSPITAL_BASED_OUTPATIENT_CLINIC_OR_DEPARTMENT_OTHER): Payer: Medicare Other | Admitting: Lab

## 2012-02-24 ENCOUNTER — Telehealth: Payer: Self-pay | Admitting: Oncology

## 2012-02-24 ENCOUNTER — Ambulatory Visit (HOSPITAL_BASED_OUTPATIENT_CLINIC_OR_DEPARTMENT_OTHER): Payer: Medicare Other | Admitting: Oncology

## 2012-02-24 VITALS — BP 201/86 | HR 69 | Temp 97.2°F | Resp 20 | Ht 67.0 in | Wt 124.0 lb

## 2012-02-24 DIAGNOSIS — I129 Hypertensive chronic kidney disease with stage 1 through stage 4 chronic kidney disease, or unspecified chronic kidney disease: Secondary | ICD-10-CM

## 2012-02-24 DIAGNOSIS — Z87891 Personal history of nicotine dependence: Secondary | ICD-10-CM

## 2012-02-24 DIAGNOSIS — D631 Anemia in chronic kidney disease: Secondary | ICD-10-CM | POA: Diagnosis not present

## 2012-02-24 DIAGNOSIS — R634 Abnormal weight loss: Secondary | ICD-10-CM

## 2012-02-24 DIAGNOSIS — D72829 Elevated white blood cell count, unspecified: Secondary | ICD-10-CM

## 2012-02-24 DIAGNOSIS — D649 Anemia, unspecified: Secondary | ICD-10-CM | POA: Diagnosis not present

## 2012-02-24 DIAGNOSIS — N189 Chronic kidney disease, unspecified: Secondary | ICD-10-CM

## 2012-02-24 DIAGNOSIS — N039 Chronic nephritic syndrome with unspecified morphologic changes: Secondary | ICD-10-CM

## 2012-02-24 LAB — COMPREHENSIVE METABOLIC PANEL (CC13)
ALT: 14 U/L (ref 0–55)
AST: 14 U/L (ref 5–34)
Albumin: 3.2 g/dL — ABNORMAL LOW (ref 3.5–5.0)
Alkaline Phosphatase: 112 U/L (ref 40–150)
BUN: 17 mg/dL (ref 7.0–26.0)
CO2: 26 meq/L (ref 22–29)
Calcium: 9.1 mg/dL (ref 8.4–10.4)
Chloride: 103 meq/L (ref 98–107)
Creatinine: 1.5 mg/dL — ABNORMAL HIGH (ref 0.7–1.3)
Glucose: 107 mg/dL — ABNORMAL HIGH (ref 70–99)
Potassium: 4.1 meq/L (ref 3.5–5.1)
Sodium: 137 meq/L (ref 136–145)
Total Bilirubin: 0.42 mg/dL (ref 0.20–1.20)
Total Protein: 6.8 g/dL (ref 6.4–8.3)

## 2012-02-24 LAB — CBC WITH DIFFERENTIAL/PLATELET
BASO%: 0.2 % (ref 0.0–2.0)
EOS%: 2.1 % (ref 0.0–7.0)
Eosinophils Absolute: 0.3 10*3/uL (ref 0.0–0.5)
LYMPH%: 13.2 % — ABNORMAL LOW (ref 14.0–49.0)
MCH: 28.8 pg (ref 27.2–33.4)
MCHC: 32.9 g/dL (ref 32.0–36.0)
MCV: 87.8 fL (ref 79.3–98.0)
MONO%: 6.7 % (ref 0.0–14.0)
Platelets: 320 10*3/uL (ref 140–400)
RBC: 3.95 10*6/uL — ABNORMAL LOW (ref 4.20–5.82)

## 2012-02-24 NOTE — Progress Notes (Signed)
[facility] OFFICE PROGRESS NOTE  Cc:  HAMRICK,MAURA L, MD  DIAGNOSIS: leukocytosis, NOS (JAK-2 mutation negative, BCR-ABL negative), and chronic normocytic anemia, NOS.    CURRENT THERAPY:  Watchful observation.   INTERVAL HISTORY: Jose Atkins 78 y.o. male returns for regular follow up with his wife.  He reports feeling relatively well.  He is active around the house and get out of the house quite often.  Patient denies fever, anorexia, weight loss, fatigue, headache, visual changes, confusion, drenching night sweats, palpable lymph node swelling, mucositis, odynophagia, dysphagia, nausea vomiting, jaundice, chest pain, palpitation, shortness of breath, dyspnea on exertion, productive cough, gum bleeding, epistaxis, hematemesis, hemoptysis, abdominal pain, abdominal swelling, early satiety, melena, hematochezia, hematuria, skin rash, spontaneous bleeding, joint swelling, joint pain, heat or cold intolerance, bowel bladder incontinence, back pain, focal motor weakness, paresthesia, depression, suicidal or homicidal ideation, feeling hopelessness.    Past Medical History  Diagnosis Date  . BPH (benign prostatic hyperplasia)   . Hyperlipemia   . CKD (chronic kidney disease)   . GERD (gastroesophageal reflux disease)   . Anemia associated with chronic renal failure   . Leukocytosis   . Weight loss 05/20/2011  . Smoking history 05/20/2011    No past surgical history on file.  Current Outpatient Prescriptions  Medication Sig Dispense Refill  . aspirin 81 MG tablet Take 81 mg by mouth daily.      . Calcium Carbonate-Vit D-Min (CALCIUM 600+D PLUS MINERALS) 600-400 MG-UNIT TABS Take 1 tablet by mouth 2 (two) times daily.      . digoxin (LANOXIN) 0.125 MG tablet Take 125 mcg by mouth daily.      Marland Kitchen diltiazem (CARDIZEM CD) 240 MG 24 hr capsule Take 240 mg by mouth 2 (two) times daily.       Marland Kitchen dutasteride (AVODART) 0.5 MG capsule Take 0.5 mg by mouth daily.      Marland Kitchen esomeprazole  (NEXIUM) 40 MG capsule Take 40 mg by mouth daily before breakfast.      . furosemide (LASIX) 20 MG tablet Take 20 mg by mouth daily.      . hydrALAZINE (APRESOLINE) 25 MG tablet Take 25 mg by mouth 2 (two) times daily.      Marland Kitchen losartan (COZAAR) 100 MG tablet Take 100 mg by mouth daily.      . Multiple Vitamin (MULTIVITAMIN) tablet Take 1 tablet by mouth daily.      . pravastatin (PRAVACHOL) 40 MG tablet Take 40 mg by mouth daily.      . solifenacin (VESICARE) 5 MG tablet Take 5 mg by mouth daily.      . [DISCONTINUED] warfarin (COUMADIN) 2.5 MG tablet Take 2.5 mg by mouth daily.        ALLERGIES:   has no known allergies.  REVIEW OF SYSTEMS:  The rest of the 14-point review of system was negative.   Filed Vitals:   02/24/12 1020  BP: 201/86  Pulse: 69  Temp: 97.2 F (36.2 C)  Resp: 20   Wt Readings from Last 3 Encounters:  02/24/12 124 lb (56.246 kg)  05/20/11 118 lb 4.8 oz (53.661 kg)  11/04/10 125 lb 3.2 oz (56.79 kg)   ECOG Performance status: 1  PHYSICAL EXAMINATION:   General:  Thin-appearing man in no acute distress.  Eyes:  no scleral icterus.  ENT:  There were no oropharyngeal lesions.  Neck was without thyromegaly.  Lymphatics:  Negative cervical, supraclavicular or axillary adenopathy.  Respiratory: lungs were clear bilaterally without  wheezing or crackles.  Cardiovascular:  Regular rate and rhythm, S1/S2, without murmur, rub or gallop.  There was no pedal edema.  GI:  abdomen was soft, flat, nontender, nondistended, without organomegaly.  Muscoloskeletal:  no spinal tenderness of palpation of vertebral spine.  Skin exam was without echymosis, petichae.  Neuro exam was nonfocal.  Patient was able to get on and off exam table without assistance.  Gait was normal.  Patient was alerted and oriented.  Attention was good.   Language was appropriate.  Mood was normal without depression.  Speech was not pressured.  Thought content was not tangential.     LABORATORY/RADIOLOGY  DATA:  Lab Results  Component Value Date   WBC 16.8* 02/24/2012   HGB 11.4* 02/24/2012   HCT 34.7* 02/24/2012   PLT 320 02/24/2012   GLUCOSE 107* 02/24/2012   CHOL  Value: 112        ATP III CLASSIFICATION:  <200     mg/dL   Desirable  200-239  mg/dL   Borderline High  >=240    mg/dL   High        02/14/2010   TRIG 104 02/14/2010   HDL 34* 02/14/2010   LDLCALC  Value: 57        Total Cholesterol/HDL:CHD Risk Coronary Heart Disease Risk Table                     Men   Women  1/2 Average Risk   3.4   3.3  Average Risk       5.0   4.4  2 X Average Risk   9.6   7.1  3 X Average Risk  23.4   11.0        Use the calculated Patient Ratio above and the CHD Risk Table to determine the patient's CHD Risk.        ATP III CLASSIFICATION (LDL):  <100     mg/dL   Optimal  100-129  mg/dL   Near or Above                    Optimal  130-159  mg/dL   Borderline  160-189  mg/dL   High  >190     mg/dL   Very High 02/14/2010   ALT 14 02/24/2012   AST 14 02/24/2012   NA 137 02/24/2012   K 4.1 02/24/2012   CL 103 02/24/2012   CREATININE 1.5* 02/24/2012   BUN 17.0 02/24/2012   CO2 26 02/24/2012   TSH 1.331 02/14/2010   INR 1.47 05/03/2010   HGBA1C  Value: 6.3 (NOTE)                                                                       According to the ADA Clinical Practice Recommendations for 2011, when HbA1c is used as a screening test:   >=6.5%   Diagnostic of Diabetes Mellitus           (if abnormal result  is confirmed)  5.7-6.4%   Increased risk of developing Diabetes Mellitus  References:Diagnosis and Classification of Diabetes Mellitus,Diabetes [id number] 1):S62-S69 and Standards of Medical Care in         Diabetes - 2011,Diabetes [id number]  (  Suppl 1):S11-S61.* 02/14/2010    ASSESSMENT AND PLAN:  1. Chronic anemia due to chronic kidney disease.  There is no active bleeding.  Anemia is stable.  There is no indication for further work up yet or blood transfusion.  2. Leukocytosis, neutrophil  predominant.  Work up was negative in the past with normal BCR/ABL and negative JAK-2.  This is most likely reactive. His leukocytosis today is better than before. We'll continue observation.   3. Hypertension.  He is on diltiazem, hydralazine, losartan per PCP.  4. History of coronary artery disease.  He is on aspirin, losartan, pravastatin per PCP.  5. Chronic kidney disease.  Most lik ely due to chronic HTN.  6. Weigh loss:  Resolved.   7. Follow up:  Recheck CBC in about 4 and 8 months.  Return visit in about 1 year.   In the future, if WBC significantly increases or worsening anemia, we may consider bone marrow biopsy.       The length of time of the face-to-face encounter was 10  minutes. More than 50% of time was spent counseling and coordination of care.

## 2012-02-24 NOTE — Telephone Encounter (Signed)
lvm for pt regarding april 2014 appt...expressed taht pt needs to get new appt schedule at nxt appt.....Marland Kitchenmailed april, aug, and dec appt schedule to pt

## 2012-04-26 DIAGNOSIS — N401 Enlarged prostate with lower urinary tract symptoms: Secondary | ICD-10-CM | POA: Diagnosis not present

## 2012-04-26 DIAGNOSIS — N200 Calculus of kidney: Secondary | ICD-10-CM | POA: Diagnosis not present

## 2012-04-28 DIAGNOSIS — Z79899 Other long term (current) drug therapy: Secondary | ICD-10-CM | POA: Diagnosis not present

## 2012-04-28 DIAGNOSIS — E7800 Pure hypercholesterolemia, unspecified: Secondary | ICD-10-CM | POA: Diagnosis not present

## 2012-04-28 DIAGNOSIS — D72829 Elevated white blood cell count, unspecified: Secondary | ICD-10-CM | POA: Diagnosis not present

## 2012-05-01 DIAGNOSIS — H4011X0 Primary open-angle glaucoma, stage unspecified: Secondary | ICD-10-CM | POA: Diagnosis not present

## 2012-05-02 DIAGNOSIS — D649 Anemia, unspecified: Secondary | ICD-10-CM | POA: Diagnosis not present

## 2012-05-02 DIAGNOSIS — R7309 Other abnormal glucose: Secondary | ICD-10-CM | POA: Diagnosis not present

## 2012-05-02 DIAGNOSIS — I10 Essential (primary) hypertension: Secondary | ICD-10-CM | POA: Diagnosis not present

## 2012-05-02 DIAGNOSIS — I4891 Unspecified atrial fibrillation: Secondary | ICD-10-CM | POA: Diagnosis not present

## 2012-06-15 ENCOUNTER — Other Ambulatory Visit (HOSPITAL_BASED_OUTPATIENT_CLINIC_OR_DEPARTMENT_OTHER): Payer: Medicare Other

## 2012-06-15 DIAGNOSIS — D72829 Elevated white blood cell count, unspecified: Secondary | ICD-10-CM | POA: Diagnosis not present

## 2012-06-15 LAB — CBC WITH DIFFERENTIAL/PLATELET
BASO%: 0.2 % (ref 0.0–2.0)
EOS%: 2.1 % (ref 0.0–7.0)
HCT: 34.9 % — ABNORMAL LOW (ref 38.4–49.9)
LYMPH%: 14.3 % (ref 14.0–49.0)
MCH: 28.6 pg (ref 27.2–33.4)
MCHC: 32.4 g/dL (ref 32.0–36.0)
NEUT%: 78 % — ABNORMAL HIGH (ref 39.0–75.0)
Platelets: 266 10*3/uL (ref 140–400)
RBC: 3.95 10*6/uL — ABNORMAL LOW (ref 4.20–5.82)

## 2012-06-20 ENCOUNTER — Telehealth: Payer: Self-pay

## 2012-06-20 NOTE — Telephone Encounter (Signed)
Message copied by Azzie Glatter on Tue Jun 20, 2012 12:40 PM ------      Message from: HA, Trudee Grip T      Created: Fri Jun 16, 2012 11:17 AM       Please call pt.  His elevated WBC and slight anemia are stable.  I again recommend keeping any eye.  No transfusion needed.  Thanks. ------

## 2012-07-03 DIAGNOSIS — H4011X0 Primary open-angle glaucoma, stage unspecified: Secondary | ICD-10-CM | POA: Diagnosis not present

## 2012-08-16 DIAGNOSIS — S20219A Contusion of unspecified front wall of thorax, initial encounter: Secondary | ICD-10-CM | POA: Diagnosis not present

## 2012-08-23 DIAGNOSIS — S20219A Contusion of unspecified front wall of thorax, initial encounter: Secondary | ICD-10-CM | POA: Diagnosis not present

## 2012-08-28 DIAGNOSIS — H4011X0 Primary open-angle glaucoma, stage unspecified: Secondary | ICD-10-CM | POA: Diagnosis not present

## 2012-09-18 DIAGNOSIS — Z9181 History of falling: Secondary | ICD-10-CM | POA: Diagnosis not present

## 2012-09-18 DIAGNOSIS — Z1331 Encounter for screening for depression: Secondary | ICD-10-CM | POA: Diagnosis not present

## 2012-09-18 DIAGNOSIS — I10 Essential (primary) hypertension: Secondary | ICD-10-CM | POA: Diagnosis not present

## 2012-09-18 DIAGNOSIS — R7309 Other abnormal glucose: Secondary | ICD-10-CM | POA: Diagnosis not present

## 2012-10-12 ENCOUNTER — Other Ambulatory Visit (HOSPITAL_BASED_OUTPATIENT_CLINIC_OR_DEPARTMENT_OTHER): Payer: Medicare Other | Admitting: Lab

## 2012-10-12 DIAGNOSIS — D72829 Elevated white blood cell count, unspecified: Secondary | ICD-10-CM | POA: Diagnosis not present

## 2012-10-12 LAB — CBC WITH DIFFERENTIAL/PLATELET
BASO%: 0.3 % (ref 0.0–2.0)
EOS%: 1.3 % (ref 0.0–7.0)
MCH: 28.7 pg (ref 27.2–33.4)
MCHC: 33.2 g/dL (ref 32.0–36.0)
RBC: 3.43 10*6/uL — ABNORMAL LOW (ref 4.20–5.82)
RDW: 16.5 % — ABNORMAL HIGH (ref 11.0–14.6)
lymph#: 2.2 10*3/uL (ref 0.9–3.3)

## 2012-10-13 ENCOUNTER — Telehealth: Payer: Self-pay

## 2012-10-13 NOTE — Telephone Encounter (Signed)
Message copied by Azzie Glatter on Fri Oct 13, 2012 11:04 AM ------      Message from: Mikey Bussing R      Created: Thu Oct 12, 2012  3:57 PM       Call pt. WBC is stable. Hgb is down slightly. Recommend that we continue to monitor. ------

## 2012-11-27 DIAGNOSIS — Z23 Encounter for immunization: Secondary | ICD-10-CM | POA: Diagnosis not present

## 2013-01-01 DIAGNOSIS — H4011X0 Primary open-angle glaucoma, stage unspecified: Secondary | ICD-10-CM | POA: Diagnosis not present

## 2013-01-03 ENCOUNTER — Telehealth: Payer: Self-pay | Admitting: Hematology and Oncology

## 2013-01-03 NOTE — Telephone Encounter (Signed)
LVMM adv 12/18 appts Cal mailed shh

## 2013-02-22 ENCOUNTER — Ambulatory Visit: Payer: Medicare Other | Admitting: Oncology

## 2013-02-22 ENCOUNTER — Other Ambulatory Visit: Payer: Medicare Other | Admitting: Lab

## 2013-02-22 ENCOUNTER — Other Ambulatory Visit: Payer: Self-pay | Admitting: Hematology and Oncology

## 2013-02-22 ENCOUNTER — Other Ambulatory Visit (HOSPITAL_BASED_OUTPATIENT_CLINIC_OR_DEPARTMENT_OTHER): Payer: Medicare Other

## 2013-02-22 ENCOUNTER — Ambulatory Visit (HOSPITAL_BASED_OUTPATIENT_CLINIC_OR_DEPARTMENT_OTHER): Payer: Medicare Other | Admitting: Hematology and Oncology

## 2013-02-22 ENCOUNTER — Encounter: Payer: Self-pay | Admitting: Hematology and Oncology

## 2013-02-22 ENCOUNTER — Encounter (INDEPENDENT_AMBULATORY_CARE_PROVIDER_SITE_OTHER): Payer: Self-pay

## 2013-02-22 VITALS — BP 187/52 | HR 68 | Temp 97.8°F | Resp 19 | Ht 67.0 in | Wt 121.3 lb

## 2013-02-22 DIAGNOSIS — D638 Anemia in other chronic diseases classified elsewhere: Secondary | ICD-10-CM | POA: Diagnosis not present

## 2013-02-22 DIAGNOSIS — D649 Anemia, unspecified: Secondary | ICD-10-CM | POA: Diagnosis not present

## 2013-02-22 DIAGNOSIS — N189 Chronic kidney disease, unspecified: Secondary | ICD-10-CM

## 2013-02-22 DIAGNOSIS — D72829 Elevated white blood cell count, unspecified: Secondary | ICD-10-CM | POA: Diagnosis not present

## 2013-02-22 DIAGNOSIS — D631 Anemia in chronic kidney disease: Secondary | ICD-10-CM | POA: Diagnosis not present

## 2013-02-22 DIAGNOSIS — N039 Chronic nephritic syndrome with unspecified morphologic changes: Secondary | ICD-10-CM | POA: Diagnosis not present

## 2013-02-22 HISTORY — DX: Anemia, unspecified: D64.9

## 2013-02-22 LAB — COMPREHENSIVE METABOLIC PANEL (CC13)
ALT: 11 U/L (ref 0–55)
Alkaline Phosphatase: 103 U/L (ref 40–150)
Anion Gap: 7 mEq/L (ref 3–11)
BUN: 11.6 mg/dL (ref 7.0–26.0)
CO2: 25 mEq/L (ref 22–29)
Creatinine: 1.5 mg/dL — ABNORMAL HIGH (ref 0.7–1.3)
Glucose: 162 mg/dl — ABNORMAL HIGH (ref 70–140)
Total Bilirubin: 0.26 mg/dL (ref 0.20–1.20)

## 2013-02-22 LAB — CBC & DIFF AND RETIC
BASO%: 0.2 % (ref 0.0–2.0)
EOS%: 1.8 % (ref 0.0–7.0)
HCT: 33.1 % — ABNORMAL LOW (ref 38.4–49.9)
HGB: 10.8 g/dL — ABNORMAL LOW (ref 13.0–17.1)
Immature Retic Fract: 10.5 % (ref 3.00–10.60)
MCH: 28.3 pg (ref 27.2–33.4)
MCHC: 32.6 g/dL (ref 32.0–36.0)
MONO%: 6.5 % (ref 0.0–14.0)
NEUT%: 76.9 % — ABNORMAL HIGH (ref 39.0–75.0)
RDW: 15.6 % — ABNORMAL HIGH (ref 11.0–14.6)
Retic Ct Abs: 57.53 10*3/uL (ref 34.80–93.90)
lymph#: 2.6 10*3/uL (ref 0.9–3.3)

## 2013-02-22 LAB — IRON AND TIBC CHCC
TIBC: 233 ug/dL (ref 202–409)
UIBC: 211 ug/dL (ref 117–376)

## 2013-02-22 NOTE — Progress Notes (Signed)
[place] OFFICE PROGRESS NOTE  HAMRICK,MAURA L, MD DIAGNOSIS: Chronic leukocytosis and anemia chronic disease  SUMMARY OF HEMATOLOGIC HISTORY: This is a pleasant 79 year old gentleman who was found to have chronic leukocytosis, predominant neutrophilia, previous workup was negative for JAK2 mutation and BCR/ABL. He has anemia chronic disease from chronic kidney failure on observation only. INTERVAL HISTORY: Jose Atkins 79 y.o. male returns for further followup. Denies any recent infection. He denies any recent fever, chills, night sweats or abnormal weight loss The patient denies any recent signs or symptoms of bleeding such as spontaneous epistaxis, hematuria or hematochezia.  I have reviewed the past medical history, past surgical history, social history and family history with the patient and they are unchanged from previous note.  ALLERGIES:  has No Known Allergies.  MEDICATIONS:  Current Outpatient Prescriptions  Medication Sig Dispense Refill  . aspirin 81 MG tablet Take 81 mg by mouth daily.      . Calcium Carbonate-Vit D-Min (CALCIUM 600+D PLUS MINERALS) 600-400 MG-UNIT TABS Take 1 tablet by mouth 2 (two) times daily.      . digoxin (LANOXIN) 0.125 MG tablet Take 125 mcg by mouth daily.      Marland Kitchen diltiazem (CARDIZEM CD) 240 MG 24 hr capsule Take 240 mg by mouth 2 (two) times daily.       Marland Kitchen dutasteride (AVODART) 0.5 MG capsule Take 0.5 mg by mouth daily.      Marland Kitchen esomeprazole (NEXIUM) 40 MG capsule Take 40 mg by mouth daily before breakfast.      . furosemide (LASIX) 20 MG tablet Take 20 mg by mouth daily.      . hydrALAZINE (APRESOLINE) 25 MG tablet Take 25 mg by mouth 2 (two) times daily.      Marland Kitchen losartan (COZAAR) 100 MG tablet Take 100 mg by mouth daily.      . Multiple Vitamin (MULTIVITAMIN) tablet Take 1 tablet by mouth daily.      . pravastatin (PRAVACHOL) 40 MG tablet Take 40 mg by mouth daily.      . solifenacin (VESICARE) 5 MG tablet Take 5 mg by mouth  daily.      . [DISCONTINUED] warfarin (COUMADIN) 2.5 MG tablet Take 2.5 mg by mouth daily.       No current facility-administered medications for this visit.     REVIEW OF SYSTEMS:   Constitutional: Denies fevers, chills or night sweats Eyes: Denies blurriness of vision Ears, nose, mouth, throat, and face: Denies mucositis or sore throat Respiratory: Denies cough, dyspnea or wheezes Cardiovascular: Denies palpitation, chest discomfort or lower extremity swelling Gastrointestinal:  Denies nausea, heartburn or change in bowel habits Skin: Denies abnormal skin rashes Lymphatics: Denies new lymphadenopathy or easy bruising Neurological:Denies numbness, tingling or new weaknesses Behavioral/Psych: Mood is stable, no new changes  All other systems were reviewed with the patient and are negative.  PHYSICAL EXAMINATION: ECOG PERFORMANCE STATUS: 0 - Asymptomatic  Filed Vitals:   02/22/13 1423  BP: 187/52  Pulse: 68  Temp: 97.8 F (36.6 C)  Resp: 19   Filed Weights   02/22/13 1423  Weight: 121 lb 4.8 oz (55.021 kg)    GENERAL:alert, no distress and comfortable SKIN: Significant skin keratosis but no rash or suspicious lesion EYES: normal, Conjunctiva are pink and non-injected, sclera clear OROPHARYNX:no exudate, no erythema and lips, buccal mucosa, and tongue normal  NECK: supple, thyroid normal size, non-tender, without nodularity LYMPH:  no palpable lymphadenopathy in the cervical, axillary or inguinal LUNGS: clear to  auscultation and percussion with normal breathing effort HEART: regular rate & rhythm and no murmurs and no lower extremity edema ABDOMEN:abdomen soft, non-tender and normal bowel sounds Musculoskeletal:no cyanosis of digits and no clubbing  NEURO: alert & oriented x 3 with fluent speech, no focal motor/sensory deficits  LABORATORY DATA:  I have reviewed the data as listed Results for orders placed in visit on 02/22/13 (from the past 48 hour(s))  CBC & DIFF AND  RETIC     Status: Abnormal   Collection Time    02/22/13  1:55 PM      Result Value Range   WBC 17.4 (*) 4.0 - 10.3 10e3/uL   NEUT# 13.4 (*) 1.5 - 6.5 10e3/uL   HGB 10.8 (*) 13.0 - 17.1 g/dL   HCT 33.1 (*) 38.4 - 49.9 %   Platelets 264  140 - 400 10e3/uL   MCV 86.9  79.3 - 98.0 fL   MCH 28.3  27.2 - 33.4 pg   MCHC 32.6  32.0 - 36.0 g/dL   RBC 3.81 (*) 4.20 - 5.82 10e6/uL   RDW 15.6 (*) 11.0 - 14.6 %   lymph# 2.6  0.9 - 3.3 10e3/uL   MONO# 1.1 (*) 0.1 - 0.9 10e3/uL   Eosinophils Absolute 0.3  0.0 - 0.5 10e3/uL   Basophils Absolute 0.0  0.0 - 0.1 10e3/uL   NEUT% 76.9 (*) 39.0 - 75.0 %   LYMPH% 14.6  14.0 - 49.0 %   MONO% 6.5  0.0 - 14.0 %   EOS% 1.8  0.0 - 7.0 %   BASO% 0.2  0.0 - 2.0 %   Retic % 1.51  0.80 - 1.80 %   Retic Ct Abs 57.53  34.80 - 93.90 10e3/uL   Immature Retic Fract 10.50  3.00 - 10.60 %  COMPREHENSIVE METABOLIC PANEL (0000000)     Status: Abnormal   Collection Time    02/22/13  1:56 PM      Result Value Range   Sodium 136  136 - 145 mEq/L   Potassium 3.8  3.5 - 5.1 mEq/L   Chloride 104  98 - 109 mEq/L   CO2 25  22 - 29 mEq/L   Glucose 162 (*) 70 - 140 mg/dl   BUN 11.6  7.0 - 26.0 mg/dL   Creatinine 1.5 (*) 0.7 - 1.3 mg/dL   Total Bilirubin 0.26  0.20 - 1.20 mg/dL   Alkaline Phosphatase 103  40 - 150 U/L   AST 15  5 - 34 U/L   ALT 11  0 - 55 U/L   Total Protein 7.2  6.4 - 8.3 g/dL   Albumin 3.1 (*) 3.5 - 5.0 g/dL   Calcium 9.2  8.4 - 10.4 mg/dL   Anion Gap 7  3 - 11 mEq/L    Lab Results  Component Value Date   WBC 17.4* 02/22/2013   HGB 10.8* 02/22/2013   HCT 33.1* 02/22/2013   MCV 86.9 02/22/2013   PLT 264 02/22/2013    ASSESSMENT & PLAN:  #1 chronic leukocytosis Causes unknown. Chronic neutrophilic leukemia cannot be ruled out. I recommend observation only as the patient is not symptomatic #2 chronic anemia This is likely anemia of chronic disease. The patient denies recent history of bleeding such as epistaxis, hematuria or hematochezia. He is  asymptomatic from the anemia. We will observe for now.  He does not require transfusion now.   All questions were answered. The patient knows to call the clinic with any problems, questions  or concerns. No barriers to learning was detected.  I spent 15 minutes counseling the patient face to face. The total time spent in the appointment was 20 minutes and more than 50% was on counseling.     [facility], [place], MD 02/22/2013 2:42 PM

## 2013-02-23 ENCOUNTER — Telehealth: Payer: Self-pay | Admitting: *Deleted

## 2013-02-23 LAB — SEDIMENTATION RATE: Sed Rate: 40 mm/hr — ABNORMAL HIGH (ref 0–16)

## 2013-02-23 LAB — ERYTHROPOIETIN: Erythropoietin: 19.4 m[IU]/mL — ABNORMAL HIGH (ref 2.6–18.5)

## 2013-02-23 NOTE — Telephone Encounter (Signed)
Lm gv appt for 08/24/13 w/labs@ 2p and ov@ 2:30p. Made pt aware that i will mail a letter/avs...td

## 2013-03-20 DIAGNOSIS — Z5181 Encounter for therapeutic drug level monitoring: Secondary | ICD-10-CM | POA: Diagnosis not present

## 2013-03-20 DIAGNOSIS — R7309 Other abnormal glucose: Secondary | ICD-10-CM | POA: Diagnosis not present

## 2013-03-20 DIAGNOSIS — D72829 Elevated white blood cell count, unspecified: Secondary | ICD-10-CM | POA: Diagnosis not present

## 2013-03-20 DIAGNOSIS — Z79899 Other long term (current) drug therapy: Secondary | ICD-10-CM | POA: Diagnosis not present

## 2013-03-20 DIAGNOSIS — E7800 Pure hypercholesterolemia, unspecified: Secondary | ICD-10-CM | POA: Diagnosis not present

## 2013-03-23 DIAGNOSIS — I10 Essential (primary) hypertension: Secondary | ICD-10-CM | POA: Diagnosis not present

## 2013-03-23 DIAGNOSIS — R7309 Other abnormal glucose: Secondary | ICD-10-CM | POA: Diagnosis not present

## 2013-03-23 DIAGNOSIS — D649 Anemia, unspecified: Secondary | ICD-10-CM | POA: Diagnosis not present

## 2013-03-23 DIAGNOSIS — E7800 Pure hypercholesterolemia, unspecified: Secondary | ICD-10-CM | POA: Diagnosis not present

## 2013-04-10 ENCOUNTER — Encounter: Payer: Self-pay | Admitting: *Deleted

## 2013-04-30 DIAGNOSIS — N200 Calculus of kidney: Secondary | ICD-10-CM | POA: Diagnosis not present

## 2013-04-30 DIAGNOSIS — N139 Obstructive and reflux uropathy, unspecified: Secondary | ICD-10-CM | POA: Diagnosis not present

## 2013-04-30 DIAGNOSIS — N401 Enlarged prostate with lower urinary tract symptoms: Secondary | ICD-10-CM | POA: Diagnosis not present

## 2013-05-10 DIAGNOSIS — I2510 Atherosclerotic heart disease of native coronary artery without angina pectoris: Secondary | ICD-10-CM | POA: Diagnosis not present

## 2013-05-10 DIAGNOSIS — N183 Chronic kidney disease, stage 3 unspecified: Secondary | ICD-10-CM | POA: Diagnosis not present

## 2013-05-10 DIAGNOSIS — I4891 Unspecified atrial fibrillation: Secondary | ICD-10-CM | POA: Diagnosis not present

## 2013-05-10 DIAGNOSIS — I119 Hypertensive heart disease without heart failure: Secondary | ICD-10-CM | POA: Diagnosis not present

## 2013-05-10 DIAGNOSIS — I252 Old myocardial infarction: Secondary | ICD-10-CM | POA: Diagnosis not present

## 2013-05-10 DIAGNOSIS — I6529 Occlusion and stenosis of unspecified carotid artery: Secondary | ICD-10-CM | POA: Diagnosis not present

## 2013-05-10 DIAGNOSIS — E785 Hyperlipidemia, unspecified: Secondary | ICD-10-CM | POA: Diagnosis not present

## 2013-05-28 DIAGNOSIS — H4011X0 Primary open-angle glaucoma, stage unspecified: Secondary | ICD-10-CM | POA: Diagnosis not present

## 2013-08-24 ENCOUNTER — Encounter: Payer: Self-pay | Admitting: Hematology and Oncology

## 2013-08-24 ENCOUNTER — Telehealth: Payer: Self-pay | Admitting: Hematology and Oncology

## 2013-08-24 ENCOUNTER — Other Ambulatory Visit (HOSPITAL_BASED_OUTPATIENT_CLINIC_OR_DEPARTMENT_OTHER): Payer: Medicare Other

## 2013-08-24 ENCOUNTER — Ambulatory Visit (HOSPITAL_BASED_OUTPATIENT_CLINIC_OR_DEPARTMENT_OTHER): Payer: Medicare Other | Admitting: Hematology and Oncology

## 2013-08-24 VITALS — BP 157/99 | HR 133 | Temp 97.3°F | Resp 18 | Ht 67.0 in | Wt 117.9 lb

## 2013-08-24 DIAGNOSIS — N039 Chronic nephritic syndrome with unspecified morphologic changes: Secondary | ICD-10-CM

## 2013-08-24 DIAGNOSIS — D72829 Elevated white blood cell count, unspecified: Secondary | ICD-10-CM

## 2013-08-24 DIAGNOSIS — N189 Chronic kidney disease, unspecified: Secondary | ICD-10-CM | POA: Diagnosis not present

## 2013-08-24 DIAGNOSIS — D631 Anemia in chronic kidney disease: Secondary | ICD-10-CM

## 2013-08-24 DIAGNOSIS — N183 Chronic kidney disease, stage 3 (moderate): Secondary | ICD-10-CM

## 2013-08-24 LAB — COMPREHENSIVE METABOLIC PANEL (CC13)
ALT: 10 U/L (ref 0–55)
AST: 12 U/L (ref 5–34)
Albumin: 3.6 g/dL (ref 3.5–5.0)
Alkaline Phosphatase: 84 U/L (ref 40–150)
Anion Gap: 9 mEq/L (ref 3–11)
BUN: 24 mg/dL (ref 7.0–26.0)
CALCIUM: 9.5 mg/dL (ref 8.4–10.4)
CHLORIDE: 107 meq/L (ref 98–109)
CO2: 23 mEq/L (ref 22–29)
Creatinine: 2 mg/dL — ABNORMAL HIGH (ref 0.7–1.3)
Glucose: 134 mg/dl (ref 70–140)
Potassium: 3.8 mEq/L (ref 3.5–5.1)
Sodium: 140 mEq/L (ref 136–145)
Total Bilirubin: 0.3 mg/dL (ref 0.20–1.20)
Total Protein: 7.4 g/dL (ref 6.4–8.3)

## 2013-08-24 LAB — CBC & DIFF AND RETIC
BASO%: 0.3 % (ref 0.0–2.0)
BASOS ABS: 0 10*3/uL (ref 0.0–0.1)
EOS ABS: 0.4 10*3/uL (ref 0.0–0.5)
EOS%: 3.3 % (ref 0.0–7.0)
HCT: 34.5 % — ABNORMAL LOW (ref 38.4–49.9)
HGB: 11.2 g/dL — ABNORMAL LOW (ref 13.0–17.1)
IMMATURE RETIC FRACT: 9.3 % (ref 3.00–10.60)
LYMPH#: 2 10*3/uL (ref 0.9–3.3)
LYMPH%: 17.4 % (ref 14.0–49.0)
MCH: 28.5 pg (ref 27.2–33.4)
MCHC: 32.5 g/dL (ref 32.0–36.0)
MCV: 87.8 fL (ref 79.3–98.0)
MONO#: 0.8 10*3/uL (ref 0.1–0.9)
MONO%: 7 % (ref 0.0–14.0)
NEUT%: 72 % (ref 39.0–75.0)
NEUTROS ABS: 8.2 10*3/uL — AB (ref 1.5–6.5)
Platelets: 243 10*3/uL (ref 140–400)
RBC: 3.93 10*6/uL — AB (ref 4.20–5.82)
RDW: 16 % — AB (ref 11.0–14.6)
RETIC %: 1.39 % (ref 0.80–1.80)
RETIC CT ABS: 54.63 10*3/uL (ref 34.80–93.90)
WBC: 11.4 10*3/uL — ABNORMAL HIGH (ref 4.0–10.3)

## 2013-08-24 LAB — SEDIMENTATION RATE: Sed Rate: 25 mm/hr — ABNORMAL HIGH (ref 0–16)

## 2013-08-24 NOTE — Assessment & Plan Note (Signed)
This is likely anemia of chronic disease. The patient denies recent history of bleeding such as epistaxis, hematuria or hematochezia. He is asymptomatic from the anemia. We will observe for now.  He does not require transfusion now.  If needed, we can consider erythropoietin stimulating agents in the future if hemoglobin dropped to less than 10 g. Due to disability of his blood count, I would to see him once a year with history, physical examination and blood work.

## 2013-08-24 NOTE — Assessment & Plan Note (Signed)
The cause is unknown. He is being observed. He has no symptoms.

## 2013-08-24 NOTE — Progress Notes (Signed)
[place] OFFICE PROGRESS NOTE  Atkins,Jose L, MD DIAGNOSIS: Chronic leukocytosis and anemia chronic disease  SUMMARY OF HEMATOLOGIC HISTORY: This is a pleasant 80 year old gentleman who was found to have chronic leukocytosis, predominant neutrophilia, previous workup was negative for JAK2 mutation and BCR/ABL. He has anemia chronic disease from chronic kidney failure on observation only. INTERVAL HISTORY: Jose Atkins 80 y.o. male returns for further followup. Denies recent infection. He bruises easily. The patient denies any recent signs or symptoms of bleeding such as spontaneous epistaxis, hematuria or hematochezia.  I have reviewed the past medical history, past surgical history, social history and family history with the patient and they are unchanged from previous note.  ALLERGIES:  has No Known Allergies.  MEDICATIONS:  Current Outpatient Prescriptions  Medication Sig Dispense Refill  . aspirin 81 MG tablet Take 81 mg by mouth daily.      . Calcium Carbonate-Vit D-Min (CALCIUM 600+D PLUS MINERALS) 600-400 MG-UNIT TABS Take 1 tablet by mouth 2 (two) times daily.      Marland Kitchen diltiazem (CARDIZEM CD) 240 MG 24 hr capsule Take 240 mg by mouth 2 (two) times daily.       Marland Kitchen esomeprazole (NEXIUM) 40 MG capsule Take 40 mg by mouth daily before breakfast.      . finasteride (PROSCAR) 5 MG tablet Take 5 mg by mouth daily.      . furosemide (LASIX) 20 MG tablet Take 20 mg by mouth daily.      . hydrALAZINE (APRESOLINE) 25 MG tablet Take 25 mg by mouth 2 (two) times daily.      Marland Kitchen losartan (COZAAR) 100 MG tablet Take 100 mg by mouth daily.      . Multiple Vitamin (MULTIVITAMIN) tablet Take 1 tablet by mouth daily.      . pravastatin (PRAVACHOL) 40 MG tablet Take 40 mg by mouth daily.      . [DISCONTINUED] warfarin (COUMADIN) 2.5 MG tablet Take 2.5 mg by mouth daily.       No current facility-administered medications for this visit.     REVIEW OF SYSTEMS:    Constitutional: Denies fevers, chills or night sweats Eyes: Denies blurriness of vision Ears, nose, mouth, throat, and face: Denies mucositis or sore throat Respiratory: Denies cough, dyspnea or wheezes Cardiovascular: Denies palpitation, chest discomfort or lower extremity swelling Gastrointestinal:  Denies nausea, heartburn or change in bowel habits Skin: Denies abnormal skin rashes Lymphatics: Denies new lymphadenopathy  Neurological:Denies numbness, tingling or new weaknesses Behavioral/Psych: Mood is stable, no new changes  All other systems were reviewed with the patient and are negative.  PHYSICAL EXAMINATION: ECOG PERFORMANCE STATUS: 0 - Asymptomatic  Filed Vitals:   08/24/13 1436  BP: 157/99  Pulse: 133  Temp:   Resp:    Filed Weights   08/24/13 1435  Weight: 117 lb 14.4 oz (53.479 kg)    GENERAL:alert, no distress and comfortable. He looks thin but not cachectic SKIN: Significant skin bruising recently not keratosis. EYES: normal, Conjunctiva are pink and non-injected, sclera clear OROPHARYNX:no exudate, no erythema and lips, buccal mucosa, and tongue normal  NECK: supple, thyroid normal size, non-tender, without nodularity LYMPH:  no palpable lymphadenopathy in the cervical, axillary or inguinal LUNGS: clear to auscultation and percussion with normal breathing effort HEART: regular rate & rhythm and no murmurs and no lower extremity edema ABDOMEN:abdomen soft, non-tender and normal bowel sounds Musculoskeletal:no cyanosis of digits and no clubbing  NEURO: alert & oriented x 3 with fluent speech, no focal  motor/sensory deficits  LABORATORY DATA:  I have reviewed the data as listed Results for orders placed in visit on 08/24/13 (from the past 48 hour(s))  CBC & DIFF AND RETIC     Status: Abnormal   Collection Time    08/24/13  2:14 PM      Result Value Ref Range   WBC 11.4 (*) 4.0 - 10.3 10e3/uL   NEUT# 8.2 (*) 1.5 - 6.5 10e3/uL   HGB 11.2 (*) 13.0 - 17.1  g/dL   HCT 34.5 (*) 38.4 - 49.9 %   Platelets 243  140 - 400 10e3/uL   MCV 87.8  79.3 - 98.0 fL   MCH 28.5  27.2 - 33.4 pg   MCHC 32.5  32.0 - 36.0 g/dL   RBC 3.93 (*) 4.20 - 5.82 10e6/uL   RDW 16.0 (*) 11.0 - 14.6 %   lymph# 2.0  0.9 - 3.3 10e3/uL   MONO# 0.8  0.1 - 0.9 10e3/uL   Eosinophils Absolute 0.4  0.0 - 0.5 10e3/uL   Basophils Absolute 0.0  0.0 - 0.1 10e3/uL   NEUT% 72.0  39.0 - 75.0 %   LYMPH% 17.4  14.0 - 49.0 %   MONO% 7.0  0.0 - 14.0 %   EOS% 3.3  0.0 - 7.0 %   BASO% 0.3  0.0 - 2.0 %   Retic % 1.39  0.80 - 1.80 %   Retic Ct Abs 54.63  34.80 - 93.90 10e3/uL   Immature Retic Fract 9.30  3.00 - 10.60 %  COMPREHENSIVE METABOLIC PANEL (0000000)     Status: Abnormal   Collection Time    08/24/13  2:15 PM      Result Value Ref Range   Sodium 140  136 - 145 mEq/L   Potassium 3.8  3.5 - 5.1 mEq/L   Chloride 107  98 - 109 mEq/L   CO2 23  22 - 29 mEq/L   Glucose 134  70 - 140 mg/dl   BUN 24.0  7.0 - 26.0 mg/dL   Creatinine 2.0 (*) 0.7 - 1.3 mg/dL   Total Bilirubin 0.30  0.20 - 1.20 mg/dL   Alkaline Phosphatase 84  40 - 150 U/L   AST 12  5 - 34 U/L   ALT 10  0 - 55 U/L   Total Protein 7.4  6.4 - 8.3 g/dL   Albumin 3.6  3.5 - 5.0 g/dL   Calcium 9.5  8.4 - 10.4 mg/dL   Anion Gap 9  3 - 11 mEq/L    Lab Results  Component Value Date   WBC 11.4* 08/24/2013   HGB 11.2* 08/24/2013   HCT 34.5* 08/24/2013   MCV 87.8 08/24/2013   PLT 243 08/24/2013    ASSESSMENT & PLAN:  LEUKOCYTOSIS The cause is unknown. He is being observed. He has no symptoms.  Anemia associated with chronic renal failure This is likely anemia of chronic disease. The patient denies recent history of bleeding such as epistaxis, hematuria or hematochezia. He is asymptomatic from the anemia. We will observe for now.  He does not require transfusion now.  If needed, we can consider erythropoietin stimulating agents in the future if hemoglobin dropped to less than 10 g. Due to disability of his blood count, I  would to see him once a year with history, physical examination and blood work.    All questions were answered. The patient knows to call the clinic with any problems, questions or concerns. No barriers to learning  was detected.  I spent 15 minutes counseling the patient face to face. The total time spent in the appointment was 20 minutes and more than 50% was on counseling.     [facility], [place], MD 08/24/2013 3:02 PM

## 2013-08-24 NOTE — Telephone Encounter (Signed)
cld & left message for pt of appt sch-will mail out sch to pt address

## 2013-08-27 DIAGNOSIS — H4011X0 Primary open-angle glaucoma, stage unspecified: Secondary | ICD-10-CM | POA: Diagnosis not present

## 2013-09-25 DIAGNOSIS — E7800 Pure hypercholesterolemia, unspecified: Secondary | ICD-10-CM | POA: Diagnosis not present

## 2013-09-25 DIAGNOSIS — Z79899 Other long term (current) drug therapy: Secondary | ICD-10-CM | POA: Diagnosis not present

## 2013-09-25 DIAGNOSIS — D72829 Elevated white blood cell count, unspecified: Secondary | ICD-10-CM | POA: Diagnosis not present

## 2013-09-25 DIAGNOSIS — R7309 Other abnormal glucose: Secondary | ICD-10-CM | POA: Diagnosis not present

## 2013-09-25 DIAGNOSIS — M545 Low back pain, unspecified: Secondary | ICD-10-CM | POA: Diagnosis not present

## 2013-09-25 DIAGNOSIS — Z5181 Encounter for therapeutic drug level monitoring: Secondary | ICD-10-CM | POA: Diagnosis not present

## 2013-09-26 ENCOUNTER — Ambulatory Visit
Admission: RE | Admit: 2013-09-26 | Discharge: 2013-09-26 | Disposition: A | Discharge status: Home / Self Care | Payer: Medicare Other | Source: Ambulatory Visit | Attending: Physician Assistant | Admitting: Physician Assistant

## 2013-09-26 ENCOUNTER — Other Ambulatory Visit: Payer: Self-pay | Admitting: Physician Assistant

## 2013-09-26 DIAGNOSIS — M4310 Spondylolisthesis, site unspecified: Secondary | ICD-10-CM | POA: Diagnosis not present

## 2013-09-26 DIAGNOSIS — M545 Low back pain: Secondary | ICD-10-CM

## 2013-10-01 DIAGNOSIS — I10 Essential (primary) hypertension: Secondary | ICD-10-CM | POA: Diagnosis not present

## 2013-10-01 DIAGNOSIS — M545 Low back pain, unspecified: Secondary | ICD-10-CM | POA: Diagnosis not present

## 2013-10-01 DIAGNOSIS — R7309 Other abnormal glucose: Secondary | ICD-10-CM | POA: Diagnosis not present

## 2013-10-01 DIAGNOSIS — E7800 Pure hypercholesterolemia, unspecified: Secondary | ICD-10-CM | POA: Diagnosis not present

## 2013-10-01 DIAGNOSIS — N183 Chronic kidney disease, stage 3 unspecified: Secondary | ICD-10-CM | POA: Diagnosis not present

## 2013-11-05 DIAGNOSIS — Z79899 Other long term (current) drug therapy: Secondary | ICD-10-CM | POA: Diagnosis not present

## 2013-11-05 DIAGNOSIS — I4891 Unspecified atrial fibrillation: Secondary | ICD-10-CM | POA: Diagnosis not present

## 2013-11-05 DIAGNOSIS — N179 Acute kidney failure, unspecified: Secondary | ICD-10-CM | POA: Diagnosis not present

## 2013-11-05 DIAGNOSIS — R112 Nausea with vomiting, unspecified: Secondary | ICD-10-CM | POA: Diagnosis not present

## 2013-11-05 DIAGNOSIS — I4892 Unspecified atrial flutter: Secondary | ICD-10-CM | POA: Diagnosis not present

## 2013-11-05 DIAGNOSIS — I509 Heart failure, unspecified: Secondary | ICD-10-CM | POA: Diagnosis not present

## 2013-11-05 DIAGNOSIS — E876 Hypokalemia: Secondary | ICD-10-CM | POA: Diagnosis not present

## 2013-11-05 DIAGNOSIS — K219 Gastro-esophageal reflux disease without esophagitis: Secondary | ICD-10-CM | POA: Diagnosis present

## 2013-11-05 DIAGNOSIS — R627 Adult failure to thrive: Secondary | ICD-10-CM | POA: Diagnosis not present

## 2013-11-05 DIAGNOSIS — R262 Difficulty in walking, not elsewhere classified: Secondary | ICD-10-CM | POA: Diagnosis not present

## 2013-11-05 DIAGNOSIS — Z7982 Long term (current) use of aspirin: Secondary | ICD-10-CM | POA: Diagnosis not present

## 2013-11-05 DIAGNOSIS — E7800 Pure hypercholesterolemia, unspecified: Secondary | ICD-10-CM | POA: Diagnosis present

## 2013-11-05 DIAGNOSIS — T460X1A Poisoning by cardiac-stimulant glycosides and drugs of similar action, accidental (unintentional), initial encounter: Secondary | ICD-10-CM | POA: Diagnosis not present

## 2013-11-05 DIAGNOSIS — Z930 Tracheostomy status: Secondary | ICD-10-CM | POA: Diagnosis not present

## 2013-11-05 DIAGNOSIS — E860 Dehydration: Secondary | ICD-10-CM | POA: Diagnosis not present

## 2013-11-05 DIAGNOSIS — R5381 Other malaise: Secondary | ICD-10-CM | POA: Diagnosis not present

## 2013-11-05 DIAGNOSIS — I252 Old myocardial infarction: Secondary | ICD-10-CM | POA: Diagnosis not present

## 2013-11-05 DIAGNOSIS — J984 Other disorders of lung: Secondary | ICD-10-CM | POA: Diagnosis not present

## 2013-11-05 DIAGNOSIS — M4310 Spondylolisthesis, site unspecified: Secondary | ICD-10-CM | POA: Diagnosis not present

## 2013-11-05 DIAGNOSIS — R000 Tachycardia, unspecified: Secondary | ICD-10-CM | POA: Diagnosis not present

## 2013-11-05 DIAGNOSIS — R0602 Shortness of breath: Secondary | ICD-10-CM | POA: Diagnosis not present

## 2013-11-05 DIAGNOSIS — N400 Enlarged prostate without lower urinary tract symptoms: Secondary | ICD-10-CM | POA: Diagnosis not present

## 2013-11-05 DIAGNOSIS — M810 Age-related osteoporosis without current pathological fracture: Secondary | ICD-10-CM | POA: Diagnosis not present

## 2013-11-05 DIAGNOSIS — I2510 Atherosclerotic heart disease of native coronary artery without angina pectoris: Secondary | ICD-10-CM | POA: Diagnosis present

## 2013-11-05 DIAGNOSIS — R0902 Hypoxemia: Secondary | ICD-10-CM | POA: Diagnosis not present

## 2013-11-05 DIAGNOSIS — D649 Anemia, unspecified: Secondary | ICD-10-CM | POA: Diagnosis not present

## 2013-11-05 DIAGNOSIS — N183 Chronic kidney disease, stage 3 unspecified: Secondary | ICD-10-CM | POA: Diagnosis not present

## 2013-11-05 DIAGNOSIS — I5033 Acute on chronic diastolic (congestive) heart failure: Secondary | ICD-10-CM | POA: Diagnosis not present

## 2013-11-05 DIAGNOSIS — J90 Pleural effusion, not elsewhere classified: Secondary | ICD-10-CM | POA: Diagnosis not present

## 2013-11-05 DIAGNOSIS — M8448XA Pathological fracture, other site, initial encounter for fracture: Secondary | ICD-10-CM | POA: Diagnosis not present

## 2013-11-05 DIAGNOSIS — J9819 Other pulmonary collapse: Secondary | ICD-10-CM | POA: Diagnosis not present

## 2013-11-05 DIAGNOSIS — I129 Hypertensive chronic kidney disease with stage 1 through stage 4 chronic kidney disease, or unspecified chronic kidney disease: Secondary | ICD-10-CM | POA: Diagnosis present

## 2013-11-05 DIAGNOSIS — R5383 Other fatigue: Secondary | ICD-10-CM | POA: Diagnosis not present

## 2013-11-05 DIAGNOSIS — I359 Nonrheumatic aortic valve disorder, unspecified: Secondary | ICD-10-CM | POA: Diagnosis not present

## 2013-11-05 DIAGNOSIS — N189 Chronic kidney disease, unspecified: Secondary | ICD-10-CM | POA: Diagnosis not present

## 2013-11-12 DIAGNOSIS — R627 Adult failure to thrive: Secondary | ICD-10-CM | POA: Diagnosis not present

## 2013-11-12 DIAGNOSIS — R0602 Shortness of breath: Secondary | ICD-10-CM | POA: Diagnosis not present

## 2013-11-12 DIAGNOSIS — N179 Acute kidney failure, unspecified: Secondary | ICD-10-CM | POA: Diagnosis not present

## 2013-11-12 DIAGNOSIS — I5030 Unspecified diastolic (congestive) heart failure: Secondary | ICD-10-CM | POA: Diagnosis not present

## 2013-11-12 DIAGNOSIS — D638 Anemia in other chronic diseases classified elsewhere: Secondary | ICD-10-CM | POA: Diagnosis not present

## 2013-11-12 DIAGNOSIS — D649 Anemia, unspecified: Secondary | ICD-10-CM | POA: Diagnosis not present

## 2013-11-12 DIAGNOSIS — R262 Difficulty in walking, not elsewhere classified: Secondary | ICD-10-CM | POA: Diagnosis not present

## 2013-11-12 DIAGNOSIS — J90 Pleural effusion, not elsewhere classified: Secondary | ICD-10-CM | POA: Diagnosis not present

## 2013-11-12 DIAGNOSIS — I2510 Atherosclerotic heart disease of native coronary artery without angina pectoris: Secondary | ICD-10-CM | POA: Diagnosis not present

## 2013-11-12 DIAGNOSIS — R000 Tachycardia, unspecified: Secondary | ICD-10-CM | POA: Diagnosis not present

## 2013-11-12 DIAGNOSIS — E876 Hypokalemia: Secondary | ICD-10-CM | POA: Diagnosis not present

## 2013-11-12 DIAGNOSIS — T460X1A Poisoning by cardiac-stimulant glycosides and drugs of similar action, accidental (unintentional), initial encounter: Secondary | ICD-10-CM | POA: Diagnosis not present

## 2013-11-12 DIAGNOSIS — K219 Gastro-esophageal reflux disease without esophagitis: Secondary | ICD-10-CM | POA: Diagnosis not present

## 2013-11-12 DIAGNOSIS — I5033 Acute on chronic diastolic (congestive) heart failure: Secondary | ICD-10-CM | POA: Diagnosis not present

## 2013-11-12 DIAGNOSIS — R0902 Hypoxemia: Secondary | ICD-10-CM | POA: Diagnosis not present

## 2013-11-12 DIAGNOSIS — I4891 Unspecified atrial fibrillation: Secondary | ICD-10-CM | POA: Diagnosis not present

## 2013-11-12 DIAGNOSIS — N400 Enlarged prostate without lower urinary tract symptoms: Secondary | ICD-10-CM | POA: Diagnosis not present

## 2013-11-12 DIAGNOSIS — R5381 Other malaise: Secondary | ICD-10-CM | POA: Diagnosis not present

## 2013-11-12 DIAGNOSIS — M810 Age-related osteoporosis without current pathological fracture: Secondary | ICD-10-CM | POA: Diagnosis not present

## 2013-11-12 DIAGNOSIS — N189 Chronic kidney disease, unspecified: Secondary | ICD-10-CM | POA: Diagnosis not present

## 2013-11-12 DIAGNOSIS — I509 Heart failure, unspecified: Secondary | ICD-10-CM | POA: Diagnosis not present

## 2013-11-13 DIAGNOSIS — I5030 Unspecified diastolic (congestive) heart failure: Secondary | ICD-10-CM | POA: Diagnosis not present

## 2013-11-13 DIAGNOSIS — D638 Anemia in other chronic diseases classified elsewhere: Secondary | ICD-10-CM | POA: Diagnosis not present

## 2013-11-13 DIAGNOSIS — I2510 Atherosclerotic heart disease of native coronary artery without angina pectoris: Secondary | ICD-10-CM | POA: Diagnosis not present

## 2013-11-13 DIAGNOSIS — I4891 Unspecified atrial fibrillation: Secondary | ICD-10-CM | POA: Diagnosis not present

## 2013-11-29 DIAGNOSIS — R269 Unspecified abnormalities of gait and mobility: Secondary | ICD-10-CM | POA: Diagnosis not present

## 2013-11-29 DIAGNOSIS — Z5189 Encounter for other specified aftercare: Secondary | ICD-10-CM | POA: Diagnosis not present

## 2013-12-03 DIAGNOSIS — Z5189 Encounter for other specified aftercare: Secondary | ICD-10-CM | POA: Diagnosis not present

## 2013-12-03 DIAGNOSIS — Z23 Encounter for immunization: Secondary | ICD-10-CM | POA: Diagnosis not present

## 2013-12-03 DIAGNOSIS — R269 Unspecified abnormalities of gait and mobility: Secondary | ICD-10-CM | POA: Diagnosis not present

## 2013-12-05 DIAGNOSIS — R269 Unspecified abnormalities of gait and mobility: Secondary | ICD-10-CM | POA: Diagnosis not present

## 2013-12-05 DIAGNOSIS — Z5189 Encounter for other specified aftercare: Secondary | ICD-10-CM | POA: Diagnosis not present

## 2013-12-06 DIAGNOSIS — N179 Acute kidney failure, unspecified: Secondary | ICD-10-CM | POA: Diagnosis not present

## 2013-12-06 DIAGNOSIS — Z1389 Encounter for screening for other disorder: Secondary | ICD-10-CM | POA: Diagnosis not present

## 2013-12-06 DIAGNOSIS — I4891 Unspecified atrial fibrillation: Secondary | ICD-10-CM | POA: Diagnosis not present

## 2013-12-06 DIAGNOSIS — Z23 Encounter for immunization: Secondary | ICD-10-CM | POA: Diagnosis not present

## 2013-12-06 DIAGNOSIS — I5033 Acute on chronic diastolic (congestive) heart failure: Secondary | ICD-10-CM | POA: Diagnosis not present

## 2013-12-06 DIAGNOSIS — D649 Anemia, unspecified: Secondary | ICD-10-CM | POA: Diagnosis not present

## 2013-12-10 DIAGNOSIS — R269 Unspecified abnormalities of gait and mobility: Secondary | ICD-10-CM | POA: Diagnosis not present

## 2013-12-10 DIAGNOSIS — Z5189 Encounter for other specified aftercare: Secondary | ICD-10-CM | POA: Diagnosis not present

## 2013-12-12 DIAGNOSIS — R269 Unspecified abnormalities of gait and mobility: Secondary | ICD-10-CM | POA: Diagnosis not present

## 2013-12-12 DIAGNOSIS — Z5189 Encounter for other specified aftercare: Secondary | ICD-10-CM | POA: Diagnosis not present

## 2013-12-17 DIAGNOSIS — R269 Unspecified abnormalities of gait and mobility: Secondary | ICD-10-CM | POA: Diagnosis not present

## 2013-12-17 DIAGNOSIS — Z5189 Encounter for other specified aftercare: Secondary | ICD-10-CM | POA: Diagnosis not present

## 2013-12-19 DIAGNOSIS — R269 Unspecified abnormalities of gait and mobility: Secondary | ICD-10-CM | POA: Diagnosis not present

## 2013-12-19 DIAGNOSIS — Z5189 Encounter for other specified aftercare: Secondary | ICD-10-CM | POA: Diagnosis not present

## 2013-12-20 DIAGNOSIS — D649 Anemia, unspecified: Secondary | ICD-10-CM | POA: Diagnosis not present

## 2013-12-24 DIAGNOSIS — Z5189 Encounter for other specified aftercare: Secondary | ICD-10-CM | POA: Diagnosis not present

## 2013-12-24 DIAGNOSIS — R269 Unspecified abnormalities of gait and mobility: Secondary | ICD-10-CM | POA: Diagnosis not present

## 2013-12-31 DIAGNOSIS — Z5189 Encounter for other specified aftercare: Secondary | ICD-10-CM | POA: Diagnosis not present

## 2013-12-31 DIAGNOSIS — R269 Unspecified abnormalities of gait and mobility: Secondary | ICD-10-CM | POA: Diagnosis not present

## 2013-12-31 DIAGNOSIS — H4010X3 Unspecified open-angle glaucoma, severe stage: Secondary | ICD-10-CM | POA: Diagnosis not present

## 2013-12-31 DIAGNOSIS — Z1212 Encounter for screening for malignant neoplasm of rectum: Secondary | ICD-10-CM | POA: Diagnosis not present

## 2014-03-19 DIAGNOSIS — R42 Dizziness and giddiness: Secondary | ICD-10-CM | POA: Diagnosis not present

## 2014-03-19 DIAGNOSIS — Z9181 History of falling: Secondary | ICD-10-CM | POA: Diagnosis not present

## 2014-03-19 DIAGNOSIS — I4891 Unspecified atrial fibrillation: Secondary | ICD-10-CM | POA: Diagnosis not present

## 2014-03-19 DIAGNOSIS — Z111 Encounter for screening for respiratory tuberculosis: Secondary | ICD-10-CM | POA: Diagnosis not present

## 2014-03-19 DIAGNOSIS — I5032 Chronic diastolic (congestive) heart failure: Secondary | ICD-10-CM | POA: Diagnosis not present

## 2014-03-19 DIAGNOSIS — I2510 Atherosclerotic heart disease of native coronary artery without angina pectoris: Secondary | ICD-10-CM | POA: Diagnosis not present

## 2014-05-02 DIAGNOSIS — R351 Nocturia: Secondary | ICD-10-CM | POA: Diagnosis not present

## 2014-05-02 DIAGNOSIS — N401 Enlarged prostate with lower urinary tract symptoms: Secondary | ICD-10-CM | POA: Diagnosis not present

## 2014-05-20 DIAGNOSIS — I2510 Atherosclerotic heart disease of native coronary artery without angina pectoris: Secondary | ICD-10-CM | POA: Diagnosis not present

## 2014-05-20 DIAGNOSIS — I6529 Occlusion and stenosis of unspecified carotid artery: Secondary | ICD-10-CM | POA: Diagnosis not present

## 2014-05-20 DIAGNOSIS — E785 Hyperlipidemia, unspecified: Secondary | ICD-10-CM | POA: Diagnosis not present

## 2014-05-20 DIAGNOSIS — I482 Chronic atrial fibrillation: Secondary | ICD-10-CM | POA: Diagnosis not present

## 2014-05-20 DIAGNOSIS — I4891 Unspecified atrial fibrillation: Secondary | ICD-10-CM | POA: Diagnosis not present

## 2014-05-20 DIAGNOSIS — R0602 Shortness of breath: Secondary | ICD-10-CM | POA: Diagnosis not present

## 2014-05-20 DIAGNOSIS — R609 Edema, unspecified: Secondary | ICD-10-CM | POA: Diagnosis not present

## 2014-05-20 DIAGNOSIS — N183 Chronic kidney disease, stage 3 (moderate): Secondary | ICD-10-CM | POA: Diagnosis not present

## 2014-05-20 DIAGNOSIS — I252 Old myocardial infarction: Secondary | ICD-10-CM | POA: Diagnosis not present

## 2014-05-20 DIAGNOSIS — I119 Hypertensive heart disease without heart failure: Secondary | ICD-10-CM | POA: Diagnosis not present

## 2014-05-22 DIAGNOSIS — Z7982 Long term (current) use of aspirin: Secondary | ICD-10-CM | POA: Diagnosis not present

## 2014-05-22 DIAGNOSIS — J180 Bronchopneumonia, unspecified organism: Secondary | ICD-10-CM | POA: Diagnosis not present

## 2014-05-22 DIAGNOSIS — R079 Chest pain, unspecified: Secondary | ICD-10-CM | POA: Diagnosis not present

## 2014-05-22 DIAGNOSIS — N184 Chronic kidney disease, stage 4 (severe): Secondary | ICD-10-CM | POA: Diagnosis not present

## 2014-05-22 DIAGNOSIS — R54 Age-related physical debility: Secondary | ICD-10-CM | POA: Diagnosis present

## 2014-05-22 DIAGNOSIS — J9601 Acute respiratory failure with hypoxia: Secondary | ICD-10-CM | POA: Diagnosis present

## 2014-05-22 DIAGNOSIS — E872 Acidosis: Secondary | ICD-10-CM | POA: Diagnosis not present

## 2014-05-22 DIAGNOSIS — I509 Heart failure, unspecified: Secondary | ICD-10-CM | POA: Diagnosis not present

## 2014-05-22 DIAGNOSIS — I249 Acute ischemic heart disease, unspecified: Secondary | ICD-10-CM | POA: Diagnosis not present

## 2014-05-22 DIAGNOSIS — I480 Paroxysmal atrial fibrillation: Secondary | ICD-10-CM | POA: Diagnosis not present

## 2014-05-22 DIAGNOSIS — I4581 Long QT syndrome: Secondary | ICD-10-CM | POA: Diagnosis not present

## 2014-05-22 DIAGNOSIS — Z79899 Other long term (current) drug therapy: Secondary | ICD-10-CM | POA: Diagnosis not present

## 2014-05-22 DIAGNOSIS — D631 Anemia in chronic kidney disease: Secondary | ICD-10-CM | POA: Diagnosis present

## 2014-05-22 DIAGNOSIS — I129 Hypertensive chronic kidney disease with stage 1 through stage 4 chronic kidney disease, or unspecified chronic kidney disease: Secondary | ICD-10-CM | POA: Diagnosis present

## 2014-05-22 DIAGNOSIS — N183 Chronic kidney disease, stage 3 (moderate): Secondary | ICD-10-CM | POA: Diagnosis not present

## 2014-05-22 DIAGNOSIS — Z7189 Other specified counseling: Secondary | ICD-10-CM | POA: Diagnosis present

## 2014-05-22 DIAGNOSIS — I498 Other specified cardiac arrhythmias: Secondary | ICD-10-CM | POA: Diagnosis not present

## 2014-05-22 DIAGNOSIS — I4460 Unspecified fascicular block: Secondary | ICD-10-CM | POA: Diagnosis not present

## 2014-05-22 DIAGNOSIS — I7090 Unspecified atherosclerosis: Secondary | ICD-10-CM | POA: Diagnosis not present

## 2014-05-22 DIAGNOSIS — R069 Unspecified abnormalities of breathing: Secondary | ICD-10-CM | POA: Diagnosis not present

## 2014-05-22 DIAGNOSIS — I5033 Acute on chronic diastolic (congestive) heart failure: Secondary | ICD-10-CM | POA: Diagnosis present

## 2014-05-22 DIAGNOSIS — R0602 Shortness of breath: Secondary | ICD-10-CM | POA: Diagnosis not present

## 2014-05-22 DIAGNOSIS — Z951 Presence of aortocoronary bypass graft: Secondary | ICD-10-CM | POA: Diagnosis not present

## 2014-05-22 DIAGNOSIS — N400 Enlarged prostate without lower urinary tract symptoms: Secondary | ICD-10-CM | POA: Diagnosis present

## 2014-05-22 DIAGNOSIS — I25810 Atherosclerosis of coronary artery bypass graft(s) without angina pectoris: Secondary | ICD-10-CM | POA: Diagnosis present

## 2014-05-22 DIAGNOSIS — I252 Old myocardial infarction: Secondary | ICD-10-CM | POA: Diagnosis not present

## 2014-05-22 DIAGNOSIS — Z9861 Coronary angioplasty status: Secondary | ICD-10-CM | POA: Diagnosis not present

## 2014-05-22 DIAGNOSIS — R000 Tachycardia, unspecified: Secondary | ICD-10-CM | POA: Diagnosis not present

## 2014-05-22 DIAGNOSIS — J90 Pleural effusion, not elsewhere classified: Secondary | ICD-10-CM | POA: Diagnosis not present

## 2014-05-22 DIAGNOSIS — R627 Adult failure to thrive: Secondary | ICD-10-CM | POA: Diagnosis present

## 2014-05-22 DIAGNOSIS — J811 Chronic pulmonary edema: Secondary | ICD-10-CM | POA: Diagnosis not present

## 2014-05-22 DIAGNOSIS — I4891 Unspecified atrial fibrillation: Secondary | ICD-10-CM | POA: Diagnosis not present

## 2014-05-22 DIAGNOSIS — I483 Typical atrial flutter: Secondary | ICD-10-CM | POA: Diagnosis not present

## 2014-05-22 DIAGNOSIS — J9600 Acute respiratory failure, unspecified whether with hypoxia or hypercapnia: Secondary | ICD-10-CM | POA: Diagnosis not present

## 2014-05-22 DIAGNOSIS — E780 Pure hypercholesterolemia: Secondary | ICD-10-CM | POA: Diagnosis present

## 2014-05-22 DIAGNOSIS — D649 Anemia, unspecified: Secondary | ICD-10-CM | POA: Diagnosis not present

## 2014-05-22 DIAGNOSIS — Z87891 Personal history of nicotine dependence: Secondary | ICD-10-CM | POA: Diagnosis not present

## 2014-05-30 DIAGNOSIS — N183 Chronic kidney disease, stage 3 (moderate): Secondary | ICD-10-CM | POA: Diagnosis not present

## 2014-05-30 DIAGNOSIS — I10 Essential (primary) hypertension: Secondary | ICD-10-CM | POA: Diagnosis not present

## 2014-05-30 DIAGNOSIS — D649 Anemia, unspecified: Secondary | ICD-10-CM | POA: Diagnosis not present

## 2014-05-30 DIAGNOSIS — I5033 Acute on chronic diastolic (congestive) heart failure: Secondary | ICD-10-CM | POA: Diagnosis not present

## 2014-05-30 DIAGNOSIS — I4891 Unspecified atrial fibrillation: Secondary | ICD-10-CM | POA: Diagnosis not present

## 2014-05-30 DIAGNOSIS — R54 Age-related physical debility: Secondary | ICD-10-CM | POA: Diagnosis not present

## 2014-05-30 DIAGNOSIS — Z6821 Body mass index (BMI) 21.0-21.9, adult: Secondary | ICD-10-CM | POA: Diagnosis not present

## 2014-05-30 DIAGNOSIS — N186 End stage renal disease: Secondary | ICD-10-CM | POA: Diagnosis not present

## 2014-05-30 DIAGNOSIS — I5032 Chronic diastolic (congestive) heart failure: Secondary | ICD-10-CM | POA: Diagnosis not present

## 2014-05-30 DIAGNOSIS — J9601 Acute respiratory failure with hypoxia: Secondary | ICD-10-CM | POA: Diagnosis not present

## 2014-05-30 DIAGNOSIS — R627 Adult failure to thrive: Secondary | ICD-10-CM | POA: Diagnosis not present

## 2014-05-30 DIAGNOSIS — R7309 Other abnormal glucose: Secondary | ICD-10-CM | POA: Diagnosis not present

## 2014-06-02 DIAGNOSIS — I10 Essential (primary) hypertension: Secondary | ICD-10-CM | POA: Diagnosis not present

## 2014-06-02 DIAGNOSIS — I5033 Acute on chronic diastolic (congestive) heart failure: Secondary | ICD-10-CM | POA: Diagnosis not present

## 2014-06-02 DIAGNOSIS — R54 Age-related physical debility: Secondary | ICD-10-CM | POA: Diagnosis not present

## 2014-06-02 DIAGNOSIS — I4891 Unspecified atrial fibrillation: Secondary | ICD-10-CM | POA: Diagnosis not present

## 2014-06-02 DIAGNOSIS — D649 Anemia, unspecified: Secondary | ICD-10-CM | POA: Diagnosis not present

## 2014-06-02 DIAGNOSIS — N183 Chronic kidney disease, stage 3 (moderate): Secondary | ICD-10-CM | POA: Diagnosis not present

## 2014-06-03 DIAGNOSIS — N183 Chronic kidney disease, stage 3 (moderate): Secondary | ICD-10-CM | POA: Diagnosis not present

## 2014-06-03 DIAGNOSIS — D649 Anemia, unspecified: Secondary | ICD-10-CM | POA: Diagnosis not present

## 2014-06-03 DIAGNOSIS — I10 Essential (primary) hypertension: Secondary | ICD-10-CM | POA: Diagnosis not present

## 2014-06-03 DIAGNOSIS — R54 Age-related physical debility: Secondary | ICD-10-CM | POA: Diagnosis not present

## 2014-06-03 DIAGNOSIS — I4891 Unspecified atrial fibrillation: Secondary | ICD-10-CM | POA: Diagnosis not present

## 2014-06-03 DIAGNOSIS — I5033 Acute on chronic diastolic (congestive) heart failure: Secondary | ICD-10-CM | POA: Diagnosis not present

## 2014-06-05 DIAGNOSIS — D649 Anemia, unspecified: Secondary | ICD-10-CM | POA: Diagnosis not present

## 2014-06-05 DIAGNOSIS — I10 Essential (primary) hypertension: Secondary | ICD-10-CM | POA: Diagnosis not present

## 2014-06-05 DIAGNOSIS — I5033 Acute on chronic diastolic (congestive) heart failure: Secondary | ICD-10-CM | POA: Diagnosis not present

## 2014-06-05 DIAGNOSIS — R54 Age-related physical debility: Secondary | ICD-10-CM | POA: Diagnosis not present

## 2014-06-05 DIAGNOSIS — N183 Chronic kidney disease, stage 3 (moderate): Secondary | ICD-10-CM | POA: Diagnosis not present

## 2014-06-05 DIAGNOSIS — I4891 Unspecified atrial fibrillation: Secondary | ICD-10-CM | POA: Diagnosis not present

## 2014-06-06 DIAGNOSIS — R079 Chest pain, unspecified: Secondary | ICD-10-CM | POA: Diagnosis not present

## 2014-06-06 DIAGNOSIS — Z823 Family history of stroke: Secondary | ICD-10-CM | POA: Diagnosis not present

## 2014-06-06 DIAGNOSIS — Z8711 Personal history of peptic ulcer disease: Secondary | ICD-10-CM | POA: Diagnosis not present

## 2014-06-06 DIAGNOSIS — Z79899 Other long term (current) drug therapy: Secondary | ICD-10-CM | POA: Diagnosis not present

## 2014-06-06 DIAGNOSIS — R0602 Shortness of breath: Secondary | ICD-10-CM | POA: Diagnosis not present

## 2014-06-06 DIAGNOSIS — N183 Chronic kidney disease, stage 3 (moderate): Secondary | ICD-10-CM | POA: Diagnosis not present

## 2014-06-06 DIAGNOSIS — Z87891 Personal history of nicotine dependence: Secondary | ICD-10-CM | POA: Diagnosis not present

## 2014-06-06 DIAGNOSIS — K219 Gastro-esophageal reflux disease without esophagitis: Secondary | ICD-10-CM | POA: Diagnosis present

## 2014-06-06 DIAGNOSIS — R0789 Other chest pain: Secondary | ICD-10-CM | POA: Diagnosis not present

## 2014-06-06 DIAGNOSIS — K921 Melena: Secondary | ICD-10-CM | POA: Diagnosis present

## 2014-06-06 DIAGNOSIS — I5043 Acute on chronic combined systolic (congestive) and diastolic (congestive) heart failure: Secondary | ICD-10-CM | POA: Diagnosis not present

## 2014-06-06 DIAGNOSIS — I509 Heart failure, unspecified: Secondary | ICD-10-CM | POA: Diagnosis not present

## 2014-06-06 DIAGNOSIS — Z8673 Personal history of transient ischemic attack (TIA), and cerebral infarction without residual deficits: Secondary | ICD-10-CM | POA: Diagnosis not present

## 2014-06-06 DIAGNOSIS — Z951 Presence of aortocoronary bypass graft: Secondary | ICD-10-CM | POA: Diagnosis not present

## 2014-06-06 DIAGNOSIS — I4891 Unspecified atrial fibrillation: Secondary | ICD-10-CM | POA: Diagnosis not present

## 2014-06-06 DIAGNOSIS — D631 Anemia in chronic kidney disease: Secondary | ICD-10-CM | POA: Diagnosis present

## 2014-06-06 DIAGNOSIS — I5033 Acute on chronic diastolic (congestive) heart failure: Secondary | ICD-10-CM | POA: Diagnosis not present

## 2014-06-06 DIAGNOSIS — Z9889 Other specified postprocedural states: Secondary | ICD-10-CM | POA: Diagnosis not present

## 2014-06-06 DIAGNOSIS — I480 Paroxysmal atrial fibrillation: Secondary | ICD-10-CM | POA: Diagnosis not present

## 2014-06-06 DIAGNOSIS — Z9981 Dependence on supplemental oxygen: Secondary | ICD-10-CM | POA: Diagnosis not present

## 2014-06-06 DIAGNOSIS — J984 Other disorders of lung: Secondary | ICD-10-CM | POA: Diagnosis not present

## 2014-06-06 DIAGNOSIS — M1990 Unspecified osteoarthritis, unspecified site: Secondary | ICD-10-CM | POA: Diagnosis present

## 2014-06-06 DIAGNOSIS — R000 Tachycardia, unspecified: Secondary | ICD-10-CM | POA: Diagnosis not present

## 2014-06-06 DIAGNOSIS — D649 Anemia, unspecified: Secondary | ICD-10-CM | POA: Diagnosis not present

## 2014-06-06 DIAGNOSIS — I2510 Atherosclerotic heart disease of native coronary artery without angina pectoris: Secondary | ICD-10-CM | POA: Diagnosis present

## 2014-06-06 DIAGNOSIS — I340 Nonrheumatic mitral (valve) insufficiency: Secondary | ICD-10-CM | POA: Diagnosis not present

## 2014-06-06 DIAGNOSIS — I252 Old myocardial infarction: Secondary | ICD-10-CM | POA: Diagnosis not present

## 2014-06-06 DIAGNOSIS — I129 Hypertensive chronic kidney disease with stage 1 through stage 4 chronic kidney disease, or unspecified chronic kidney disease: Secondary | ICD-10-CM | POA: Diagnosis present

## 2014-06-06 DIAGNOSIS — Z7982 Long term (current) use of aspirin: Secondary | ICD-10-CM | POA: Diagnosis not present

## 2014-06-06 DIAGNOSIS — R627 Adult failure to thrive: Secondary | ICD-10-CM | POA: Diagnosis not present

## 2014-06-06 DIAGNOSIS — J90 Pleural effusion, not elsewhere classified: Secondary | ICD-10-CM | POA: Diagnosis not present

## 2014-06-06 DIAGNOSIS — I10 Essential (primary) hypertension: Secondary | ICD-10-CM | POA: Diagnosis not present

## 2014-06-06 DIAGNOSIS — I429 Cardiomyopathy, unspecified: Secondary | ICD-10-CM | POA: Diagnosis present

## 2014-06-06 DIAGNOSIS — R54 Age-related physical debility: Secondary | ICD-10-CM | POA: Diagnosis present

## 2014-06-06 DIAGNOSIS — J180 Bronchopneumonia, unspecified organism: Secondary | ICD-10-CM | POA: Diagnosis not present

## 2014-06-06 DIAGNOSIS — N184 Chronic kidney disease, stage 4 (severe): Secondary | ICD-10-CM | POA: Diagnosis present

## 2014-06-06 DIAGNOSIS — E780 Pure hypercholesterolemia: Secondary | ICD-10-CM | POA: Diagnosis present

## 2014-06-06 DIAGNOSIS — I517 Cardiomegaly: Secondary | ICD-10-CM | POA: Diagnosis not present

## 2014-06-06 DIAGNOSIS — I361 Nonrheumatic tricuspid (valve) insufficiency: Secondary | ICD-10-CM | POA: Diagnosis not present

## 2014-06-06 DIAGNOSIS — I483 Typical atrial flutter: Secondary | ICD-10-CM | POA: Diagnosis not present

## 2014-06-06 DIAGNOSIS — R64 Cachexia: Secondary | ICD-10-CM | POA: Diagnosis present

## 2014-06-13 DIAGNOSIS — I4891 Unspecified atrial fibrillation: Secondary | ICD-10-CM | POA: Diagnosis not present

## 2014-06-13 DIAGNOSIS — D649 Anemia, unspecified: Secondary | ICD-10-CM | POA: Diagnosis not present

## 2014-06-13 DIAGNOSIS — R54 Age-related physical debility: Secondary | ICD-10-CM | POA: Diagnosis not present

## 2014-06-13 DIAGNOSIS — N183 Chronic kidney disease, stage 3 (moderate): Secondary | ICD-10-CM | POA: Diagnosis not present

## 2014-06-13 DIAGNOSIS — I5033 Acute on chronic diastolic (congestive) heart failure: Secondary | ICD-10-CM | POA: Diagnosis not present

## 2014-06-13 DIAGNOSIS — I10 Essential (primary) hypertension: Secondary | ICD-10-CM | POA: Diagnosis not present

## 2014-06-14 DIAGNOSIS — D649 Anemia, unspecified: Secondary | ICD-10-CM | POA: Diagnosis not present

## 2014-06-14 DIAGNOSIS — R54 Age-related physical debility: Secondary | ICD-10-CM | POA: Diagnosis not present

## 2014-06-14 DIAGNOSIS — I4891 Unspecified atrial fibrillation: Secondary | ICD-10-CM | POA: Diagnosis not present

## 2014-06-14 DIAGNOSIS — N183 Chronic kidney disease, stage 3 (moderate): Secondary | ICD-10-CM | POA: Diagnosis not present

## 2014-06-14 DIAGNOSIS — I10 Essential (primary) hypertension: Secondary | ICD-10-CM | POA: Diagnosis not present

## 2014-06-14 DIAGNOSIS — I5033 Acute on chronic diastolic (congestive) heart failure: Secondary | ICD-10-CM | POA: Diagnosis not present

## 2014-06-16 DIAGNOSIS — N183 Chronic kidney disease, stage 3 (moderate): Secondary | ICD-10-CM | POA: Diagnosis not present

## 2014-06-16 DIAGNOSIS — I5033 Acute on chronic diastolic (congestive) heart failure: Secondary | ICD-10-CM | POA: Diagnosis not present

## 2014-06-16 DIAGNOSIS — D649 Anemia, unspecified: Secondary | ICD-10-CM | POA: Diagnosis not present

## 2014-06-16 DIAGNOSIS — R54 Age-related physical debility: Secondary | ICD-10-CM | POA: Diagnosis not present

## 2014-06-16 DIAGNOSIS — I4891 Unspecified atrial fibrillation: Secondary | ICD-10-CM | POA: Diagnosis not present

## 2014-06-16 DIAGNOSIS — I10 Essential (primary) hypertension: Secondary | ICD-10-CM | POA: Diagnosis not present

## 2014-06-17 DIAGNOSIS — I5033 Acute on chronic diastolic (congestive) heart failure: Secondary | ICD-10-CM | POA: Diagnosis not present

## 2014-06-17 DIAGNOSIS — I10 Essential (primary) hypertension: Secondary | ICD-10-CM | POA: Diagnosis not present

## 2014-06-17 DIAGNOSIS — D649 Anemia, unspecified: Secondary | ICD-10-CM | POA: Diagnosis not present

## 2014-06-17 DIAGNOSIS — R54 Age-related physical debility: Secondary | ICD-10-CM | POA: Diagnosis not present

## 2014-06-17 DIAGNOSIS — N183 Chronic kidney disease, stage 3 (moderate): Secondary | ICD-10-CM | POA: Diagnosis not present

## 2014-06-17 DIAGNOSIS — I4891 Unspecified atrial fibrillation: Secondary | ICD-10-CM | POA: Diagnosis not present

## 2014-06-18 DIAGNOSIS — I5033 Acute on chronic diastolic (congestive) heart failure: Secondary | ICD-10-CM | POA: Diagnosis not present

## 2014-06-18 DIAGNOSIS — N183 Chronic kidney disease, stage 3 (moderate): Secondary | ICD-10-CM | POA: Diagnosis not present

## 2014-06-18 DIAGNOSIS — R54 Age-related physical debility: Secondary | ICD-10-CM | POA: Diagnosis not present

## 2014-06-18 DIAGNOSIS — D649 Anemia, unspecified: Secondary | ICD-10-CM | POA: Diagnosis not present

## 2014-06-18 DIAGNOSIS — I4891 Unspecified atrial fibrillation: Secondary | ICD-10-CM | POA: Diagnosis not present

## 2014-06-18 DIAGNOSIS — I10 Essential (primary) hypertension: Secondary | ICD-10-CM | POA: Diagnosis not present

## 2014-06-19 DIAGNOSIS — I4891 Unspecified atrial fibrillation: Secondary | ICD-10-CM | POA: Diagnosis not present

## 2014-06-19 DIAGNOSIS — R54 Age-related physical debility: Secondary | ICD-10-CM | POA: Diagnosis not present

## 2014-06-19 DIAGNOSIS — D649 Anemia, unspecified: Secondary | ICD-10-CM | POA: Diagnosis not present

## 2014-06-19 DIAGNOSIS — N183 Chronic kidney disease, stage 3 (moderate): Secondary | ICD-10-CM | POA: Diagnosis not present

## 2014-06-19 DIAGNOSIS — I5033 Acute on chronic diastolic (congestive) heart failure: Secondary | ICD-10-CM | POA: Diagnosis not present

## 2014-06-19 DIAGNOSIS — I10 Essential (primary) hypertension: Secondary | ICD-10-CM | POA: Diagnosis not present

## 2014-06-20 DIAGNOSIS — N183 Chronic kidney disease, stage 3 (moderate): Secondary | ICD-10-CM | POA: Diagnosis not present

## 2014-06-20 DIAGNOSIS — I10 Essential (primary) hypertension: Secondary | ICD-10-CM | POA: Diagnosis not present

## 2014-06-20 DIAGNOSIS — R54 Age-related physical debility: Secondary | ICD-10-CM | POA: Diagnosis not present

## 2014-06-20 DIAGNOSIS — D649 Anemia, unspecified: Secondary | ICD-10-CM | POA: Diagnosis not present

## 2014-06-20 DIAGNOSIS — I5033 Acute on chronic diastolic (congestive) heart failure: Secondary | ICD-10-CM | POA: Diagnosis not present

## 2014-06-20 DIAGNOSIS — I4891 Unspecified atrial fibrillation: Secondary | ICD-10-CM | POA: Diagnosis not present

## 2014-06-22 DIAGNOSIS — I4891 Unspecified atrial fibrillation: Secondary | ICD-10-CM | POA: Diagnosis not present

## 2014-06-22 DIAGNOSIS — N183 Chronic kidney disease, stage 3 (moderate): Secondary | ICD-10-CM | POA: Diagnosis not present

## 2014-06-22 DIAGNOSIS — D649 Anemia, unspecified: Secondary | ICD-10-CM | POA: Diagnosis not present

## 2014-06-22 DIAGNOSIS — I5033 Acute on chronic diastolic (congestive) heart failure: Secondary | ICD-10-CM | POA: Diagnosis not present

## 2014-06-22 DIAGNOSIS — E876 Hypokalemia: Secondary | ICD-10-CM | POA: Diagnosis not present

## 2014-06-22 DIAGNOSIS — I10 Essential (primary) hypertension: Secondary | ICD-10-CM | POA: Diagnosis not present

## 2014-06-22 DIAGNOSIS — R54 Age-related physical debility: Secondary | ICD-10-CM | POA: Diagnosis not present

## 2014-06-23 DIAGNOSIS — I5033 Acute on chronic diastolic (congestive) heart failure: Secondary | ICD-10-CM | POA: Diagnosis not present

## 2014-06-23 DIAGNOSIS — I4891 Unspecified atrial fibrillation: Secondary | ICD-10-CM | POA: Diagnosis not present

## 2014-06-23 DIAGNOSIS — D649 Anemia, unspecified: Secondary | ICD-10-CM | POA: Diagnosis not present

## 2014-06-23 DIAGNOSIS — N183 Chronic kidney disease, stage 3 (moderate): Secondary | ICD-10-CM | POA: Diagnosis not present

## 2014-06-23 DIAGNOSIS — R54 Age-related physical debility: Secondary | ICD-10-CM | POA: Diagnosis not present

## 2014-06-23 DIAGNOSIS — I10 Essential (primary) hypertension: Secondary | ICD-10-CM | POA: Diagnosis not present

## 2014-06-24 DIAGNOSIS — I5033 Acute on chronic diastolic (congestive) heart failure: Secondary | ICD-10-CM | POA: Diagnosis not present

## 2014-06-24 DIAGNOSIS — N183 Chronic kidney disease, stage 3 (moderate): Secondary | ICD-10-CM | POA: Diagnosis not present

## 2014-06-24 DIAGNOSIS — I10 Essential (primary) hypertension: Secondary | ICD-10-CM | POA: Diagnosis not present

## 2014-06-24 DIAGNOSIS — R54 Age-related physical debility: Secondary | ICD-10-CM | POA: Diagnosis not present

## 2014-06-24 DIAGNOSIS — I4891 Unspecified atrial fibrillation: Secondary | ICD-10-CM | POA: Diagnosis not present

## 2014-06-24 DIAGNOSIS — D649 Anemia, unspecified: Secondary | ICD-10-CM | POA: Diagnosis not present

## 2014-06-25 DIAGNOSIS — I5033 Acute on chronic diastolic (congestive) heart failure: Secondary | ICD-10-CM | POA: Diagnosis not present

## 2014-06-25 DIAGNOSIS — N183 Chronic kidney disease, stage 3 (moderate): Secondary | ICD-10-CM | POA: Diagnosis not present

## 2014-06-25 DIAGNOSIS — R54 Age-related physical debility: Secondary | ICD-10-CM | POA: Diagnosis not present

## 2014-06-25 DIAGNOSIS — I4891 Unspecified atrial fibrillation: Secondary | ICD-10-CM | POA: Diagnosis not present

## 2014-06-25 DIAGNOSIS — I10 Essential (primary) hypertension: Secondary | ICD-10-CM | POA: Diagnosis not present

## 2014-06-25 DIAGNOSIS — D649 Anemia, unspecified: Secondary | ICD-10-CM | POA: Diagnosis not present

## 2014-06-26 DIAGNOSIS — I4891 Unspecified atrial fibrillation: Secondary | ICD-10-CM | POA: Diagnosis not present

## 2014-06-26 DIAGNOSIS — I5033 Acute on chronic diastolic (congestive) heart failure: Secondary | ICD-10-CM | POA: Diagnosis not present

## 2014-06-26 DIAGNOSIS — D649 Anemia, unspecified: Secondary | ICD-10-CM | POA: Diagnosis not present

## 2014-06-26 DIAGNOSIS — R54 Age-related physical debility: Secondary | ICD-10-CM | POA: Diagnosis not present

## 2014-06-26 DIAGNOSIS — N183 Chronic kidney disease, stage 3 (moderate): Secondary | ICD-10-CM | POA: Diagnosis not present

## 2014-06-26 DIAGNOSIS — I10 Essential (primary) hypertension: Secondary | ICD-10-CM | POA: Diagnosis not present

## 2014-06-28 DIAGNOSIS — N183 Chronic kidney disease, stage 3 (moderate): Secondary | ICD-10-CM | POA: Diagnosis not present

## 2014-06-28 DIAGNOSIS — I10 Essential (primary) hypertension: Secondary | ICD-10-CM | POA: Diagnosis not present

## 2014-06-28 DIAGNOSIS — R54 Age-related physical debility: Secondary | ICD-10-CM | POA: Diagnosis not present

## 2014-06-28 DIAGNOSIS — D649 Anemia, unspecified: Secondary | ICD-10-CM | POA: Diagnosis not present

## 2014-06-28 DIAGNOSIS — I5033 Acute on chronic diastolic (congestive) heart failure: Secondary | ICD-10-CM | POA: Diagnosis not present

## 2014-06-28 DIAGNOSIS — I4891 Unspecified atrial fibrillation: Secondary | ICD-10-CM | POA: Diagnosis not present

## 2014-06-30 DIAGNOSIS — D649 Anemia, unspecified: Secondary | ICD-10-CM | POA: Diagnosis not present

## 2014-06-30 DIAGNOSIS — R54 Age-related physical debility: Secondary | ICD-10-CM | POA: Diagnosis not present

## 2014-06-30 DIAGNOSIS — I4891 Unspecified atrial fibrillation: Secondary | ICD-10-CM | POA: Diagnosis not present

## 2014-06-30 DIAGNOSIS — N183 Chronic kidney disease, stage 3 (moderate): Secondary | ICD-10-CM | POA: Diagnosis not present

## 2014-06-30 DIAGNOSIS — I10 Essential (primary) hypertension: Secondary | ICD-10-CM | POA: Diagnosis not present

## 2014-06-30 DIAGNOSIS — I5033 Acute on chronic diastolic (congestive) heart failure: Secondary | ICD-10-CM | POA: Diagnosis not present

## 2014-07-01 DIAGNOSIS — H4010X3 Unspecified open-angle glaucoma, severe stage: Secondary | ICD-10-CM | POA: Diagnosis not present

## 2014-07-01 DIAGNOSIS — N183 Chronic kidney disease, stage 3 (moderate): Secondary | ICD-10-CM | POA: Diagnosis not present

## 2014-07-01 DIAGNOSIS — R54 Age-related physical debility: Secondary | ICD-10-CM | POA: Diagnosis not present

## 2014-07-01 DIAGNOSIS — I5033 Acute on chronic diastolic (congestive) heart failure: Secondary | ICD-10-CM | POA: Diagnosis not present

## 2014-07-01 DIAGNOSIS — D649 Anemia, unspecified: Secondary | ICD-10-CM | POA: Diagnosis not present

## 2014-07-01 DIAGNOSIS — I4891 Unspecified atrial fibrillation: Secondary | ICD-10-CM | POA: Diagnosis not present

## 2014-07-01 DIAGNOSIS — I10 Essential (primary) hypertension: Secondary | ICD-10-CM | POA: Diagnosis not present

## 2014-07-02 DIAGNOSIS — I4891 Unspecified atrial fibrillation: Secondary | ICD-10-CM | POA: Diagnosis not present

## 2014-07-02 DIAGNOSIS — I10 Essential (primary) hypertension: Secondary | ICD-10-CM | POA: Diagnosis not present

## 2014-07-03 DIAGNOSIS — D649 Anemia, unspecified: Secondary | ICD-10-CM | POA: Diagnosis not present

## 2014-07-03 DIAGNOSIS — I5033 Acute on chronic diastolic (congestive) heart failure: Secondary | ICD-10-CM | POA: Diagnosis not present

## 2014-07-03 DIAGNOSIS — I4891 Unspecified atrial fibrillation: Secondary | ICD-10-CM | POA: Diagnosis not present

## 2014-07-03 DIAGNOSIS — R54 Age-related physical debility: Secondary | ICD-10-CM | POA: Diagnosis not present

## 2014-07-03 DIAGNOSIS — I10 Essential (primary) hypertension: Secondary | ICD-10-CM | POA: Diagnosis not present

## 2014-07-03 DIAGNOSIS — N183 Chronic kidney disease, stage 3 (moderate): Secondary | ICD-10-CM | POA: Diagnosis not present

## 2014-07-05 DIAGNOSIS — R54 Age-related physical debility: Secondary | ICD-10-CM | POA: Diagnosis not present

## 2014-07-05 DIAGNOSIS — I5033 Acute on chronic diastolic (congestive) heart failure: Secondary | ICD-10-CM | POA: Diagnosis not present

## 2014-07-05 DIAGNOSIS — D649 Anemia, unspecified: Secondary | ICD-10-CM | POA: Diagnosis not present

## 2014-07-05 DIAGNOSIS — I10 Essential (primary) hypertension: Secondary | ICD-10-CM | POA: Diagnosis not present

## 2014-07-05 DIAGNOSIS — I4891 Unspecified atrial fibrillation: Secondary | ICD-10-CM | POA: Diagnosis not present

## 2014-07-05 DIAGNOSIS — N183 Chronic kidney disease, stage 3 (moderate): Secondary | ICD-10-CM | POA: Diagnosis not present

## 2014-07-08 DIAGNOSIS — I10 Essential (primary) hypertension: Secondary | ICD-10-CM | POA: Diagnosis not present

## 2014-07-08 DIAGNOSIS — I5033 Acute on chronic diastolic (congestive) heart failure: Secondary | ICD-10-CM | POA: Diagnosis not present

## 2014-07-08 DIAGNOSIS — I4891 Unspecified atrial fibrillation: Secondary | ICD-10-CM | POA: Diagnosis not present

## 2014-07-08 DIAGNOSIS — R54 Age-related physical debility: Secondary | ICD-10-CM | POA: Diagnosis not present

## 2014-07-08 DIAGNOSIS — D649 Anemia, unspecified: Secondary | ICD-10-CM | POA: Diagnosis not present

## 2014-07-08 DIAGNOSIS — N183 Chronic kidney disease, stage 3 (moderate): Secondary | ICD-10-CM | POA: Diagnosis not present

## 2014-07-10 DIAGNOSIS — D649 Anemia, unspecified: Secondary | ICD-10-CM | POA: Diagnosis not present

## 2014-07-10 DIAGNOSIS — N183 Chronic kidney disease, stage 3 (moderate): Secondary | ICD-10-CM | POA: Diagnosis not present

## 2014-07-10 DIAGNOSIS — I10 Essential (primary) hypertension: Secondary | ICD-10-CM | POA: Diagnosis not present

## 2014-07-10 DIAGNOSIS — R54 Age-related physical debility: Secondary | ICD-10-CM | POA: Diagnosis not present

## 2014-07-10 DIAGNOSIS — I4891 Unspecified atrial fibrillation: Secondary | ICD-10-CM | POA: Diagnosis not present

## 2014-07-10 DIAGNOSIS — I5033 Acute on chronic diastolic (congestive) heart failure: Secondary | ICD-10-CM | POA: Diagnosis not present

## 2014-07-17 DIAGNOSIS — R54 Age-related physical debility: Secondary | ICD-10-CM | POA: Diagnosis not present

## 2014-07-17 DIAGNOSIS — N183 Chronic kidney disease, stage 3 (moderate): Secondary | ICD-10-CM | POA: Diagnosis not present

## 2014-07-17 DIAGNOSIS — I5033 Acute on chronic diastolic (congestive) heart failure: Secondary | ICD-10-CM | POA: Diagnosis not present

## 2014-07-17 DIAGNOSIS — I4891 Unspecified atrial fibrillation: Secondary | ICD-10-CM | POA: Diagnosis not present

## 2014-07-17 DIAGNOSIS — D649 Anemia, unspecified: Secondary | ICD-10-CM | POA: Diagnosis not present

## 2014-07-17 DIAGNOSIS — I10 Essential (primary) hypertension: Secondary | ICD-10-CM | POA: Diagnosis not present

## 2014-07-24 DIAGNOSIS — I5033 Acute on chronic diastolic (congestive) heart failure: Secondary | ICD-10-CM | POA: Diagnosis not present

## 2014-07-24 DIAGNOSIS — D649 Anemia, unspecified: Secondary | ICD-10-CM | POA: Diagnosis not present

## 2014-07-24 DIAGNOSIS — I4891 Unspecified atrial fibrillation: Secondary | ICD-10-CM | POA: Diagnosis not present

## 2014-07-24 DIAGNOSIS — N183 Chronic kidney disease, stage 3 (moderate): Secondary | ICD-10-CM | POA: Diagnosis not present

## 2014-07-24 DIAGNOSIS — I10 Essential (primary) hypertension: Secondary | ICD-10-CM | POA: Diagnosis not present

## 2014-07-24 DIAGNOSIS — R54 Age-related physical debility: Secondary | ICD-10-CM | POA: Diagnosis not present

## 2014-08-01 DIAGNOSIS — I340 Nonrheumatic mitral (valve) insufficiency: Secondary | ICD-10-CM | POA: Diagnosis not present

## 2014-08-01 DIAGNOSIS — I2510 Atherosclerotic heart disease of native coronary artery without angina pectoris: Secondary | ICD-10-CM | POA: Diagnosis not present

## 2014-08-01 DIAGNOSIS — I5022 Chronic systolic (congestive) heart failure: Secondary | ICD-10-CM | POA: Diagnosis not present

## 2014-08-01 DIAGNOSIS — I482 Chronic atrial fibrillation: Secondary | ICD-10-CM | POA: Diagnosis not present

## 2014-08-01 DIAGNOSIS — I5032 Chronic diastolic (congestive) heart failure: Secondary | ICD-10-CM | POA: Diagnosis not present

## 2014-08-01 DIAGNOSIS — E782 Mixed hyperlipidemia: Secondary | ICD-10-CM | POA: Diagnosis not present

## 2014-08-01 DIAGNOSIS — I5031 Acute diastolic (congestive) heart failure: Secondary | ICD-10-CM | POA: Diagnosis not present

## 2014-08-26 DIAGNOSIS — R7309 Other abnormal glucose: Secondary | ICD-10-CM | POA: Diagnosis not present

## 2014-08-26 DIAGNOSIS — N184 Chronic kidney disease, stage 4 (severe): Secondary | ICD-10-CM | POA: Diagnosis not present

## 2014-08-26 DIAGNOSIS — D72829 Elevated white blood cell count, unspecified: Secondary | ICD-10-CM | POA: Diagnosis not present

## 2014-08-26 DIAGNOSIS — Z6820 Body mass index (BMI) 20.0-20.9, adult: Secondary | ICD-10-CM | POA: Diagnosis not present

## 2014-08-26 DIAGNOSIS — I10 Essential (primary) hypertension: Secondary | ICD-10-CM | POA: Diagnosis not present

## 2014-08-26 DIAGNOSIS — Z1389 Encounter for screening for other disorder: Secondary | ICD-10-CM | POA: Diagnosis not present

## 2014-08-26 DIAGNOSIS — D649 Anemia, unspecified: Secondary | ICD-10-CM | POA: Diagnosis not present

## 2014-08-28 ENCOUNTER — Telehealth: Payer: Self-pay | Admitting: Hematology and Oncology

## 2014-08-28 NOTE — Telephone Encounter (Signed)
returned call and cx appt per pt request..Marland KitchenMarland KitchenMarland Kitchenpt wife did not want to r/s at this time

## 2014-08-29 ENCOUNTER — Other Ambulatory Visit: Payer: Medicare Other

## 2014-08-29 ENCOUNTER — Ambulatory Visit: Payer: Medicare Other | Admitting: Hematology and Oncology

## 2014-09-20 IMAGING — CR DG LUMBAR SPINE COMPLETE 4+V
5 series · 5 of 5 positions shown · non-contrast
Comparison: 12/03/2002

CLINICAL DATA: Chronic low back pain

EXAM:
LUMBAR SPINE - COMPLETE 4+ VIEW

[view not recorded (1 of 5)]
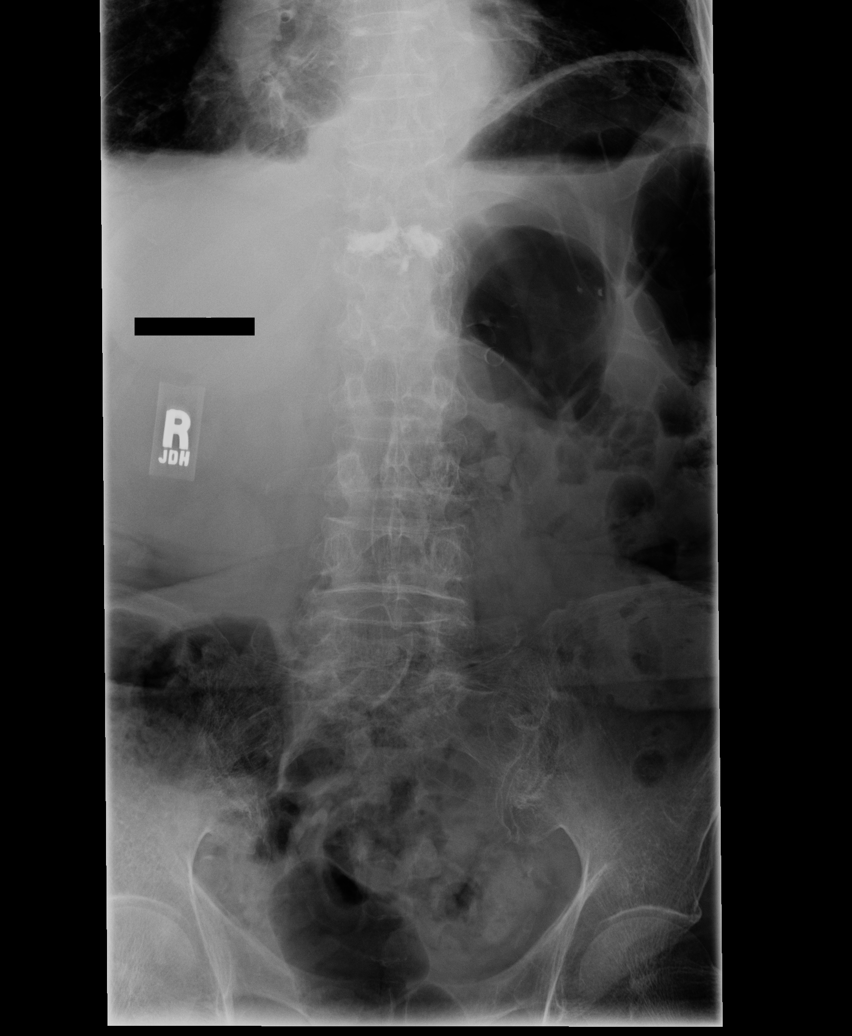

[view not recorded (2 of 5)]
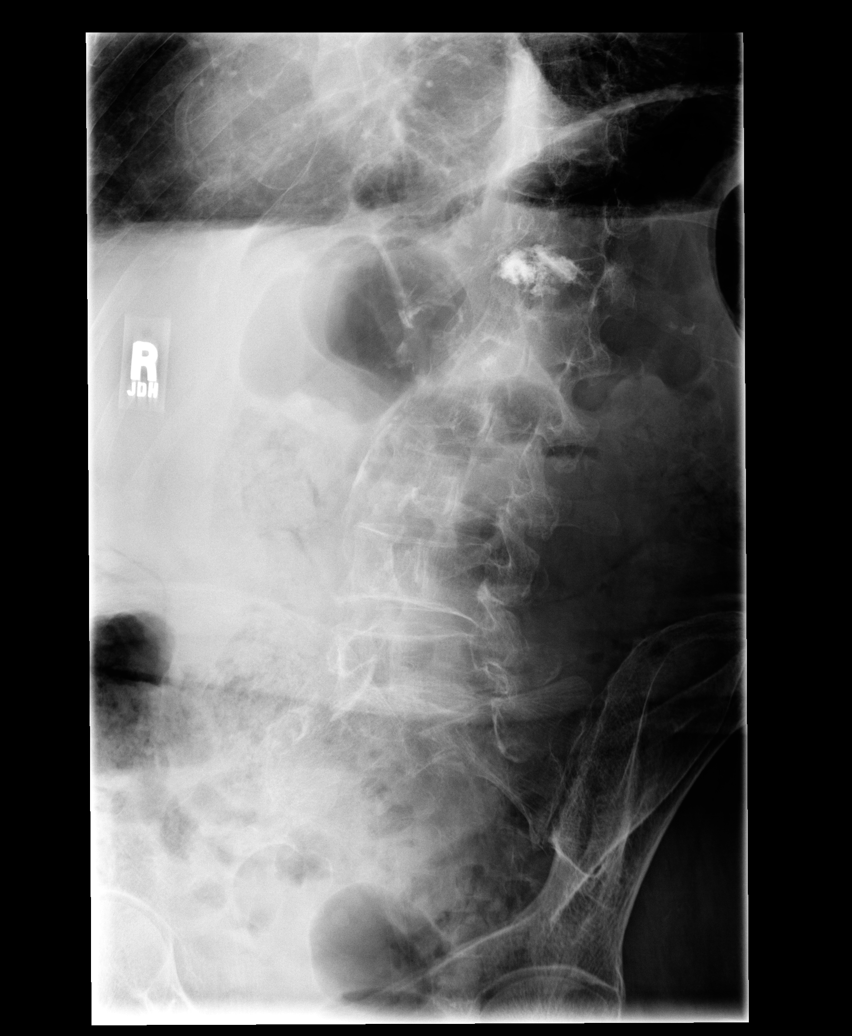

[view not recorded (3 of 5)]
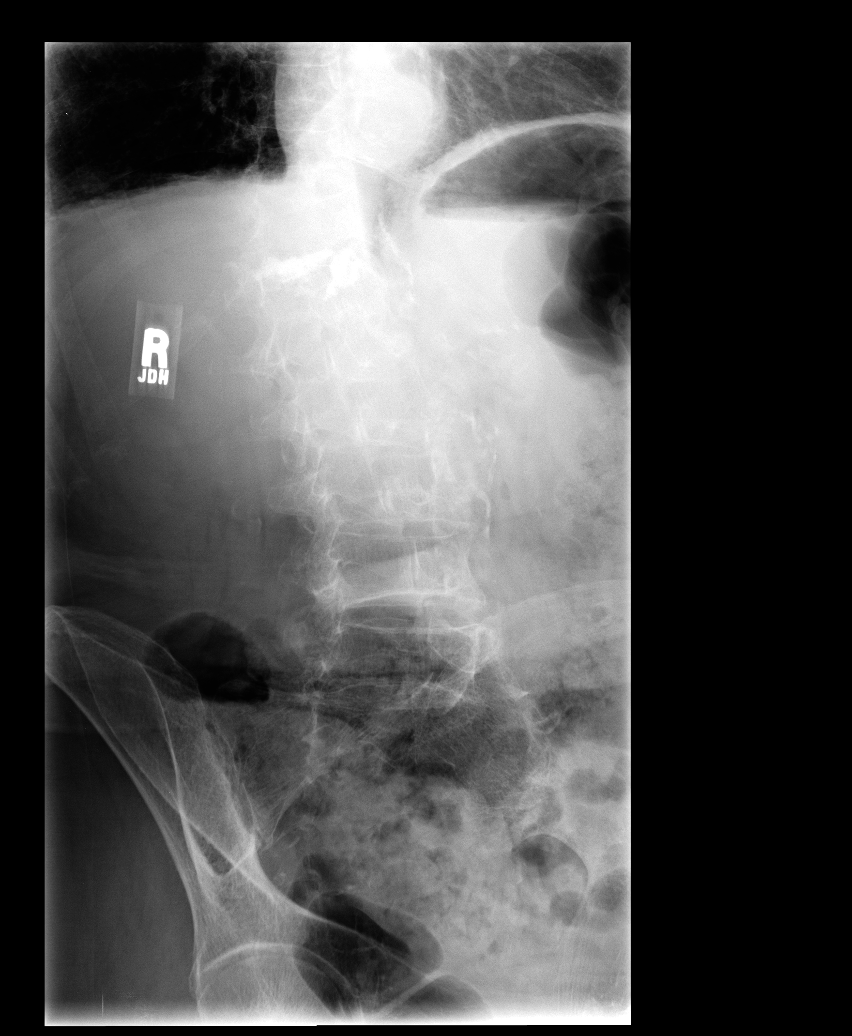

[view not recorded (4 of 5)]
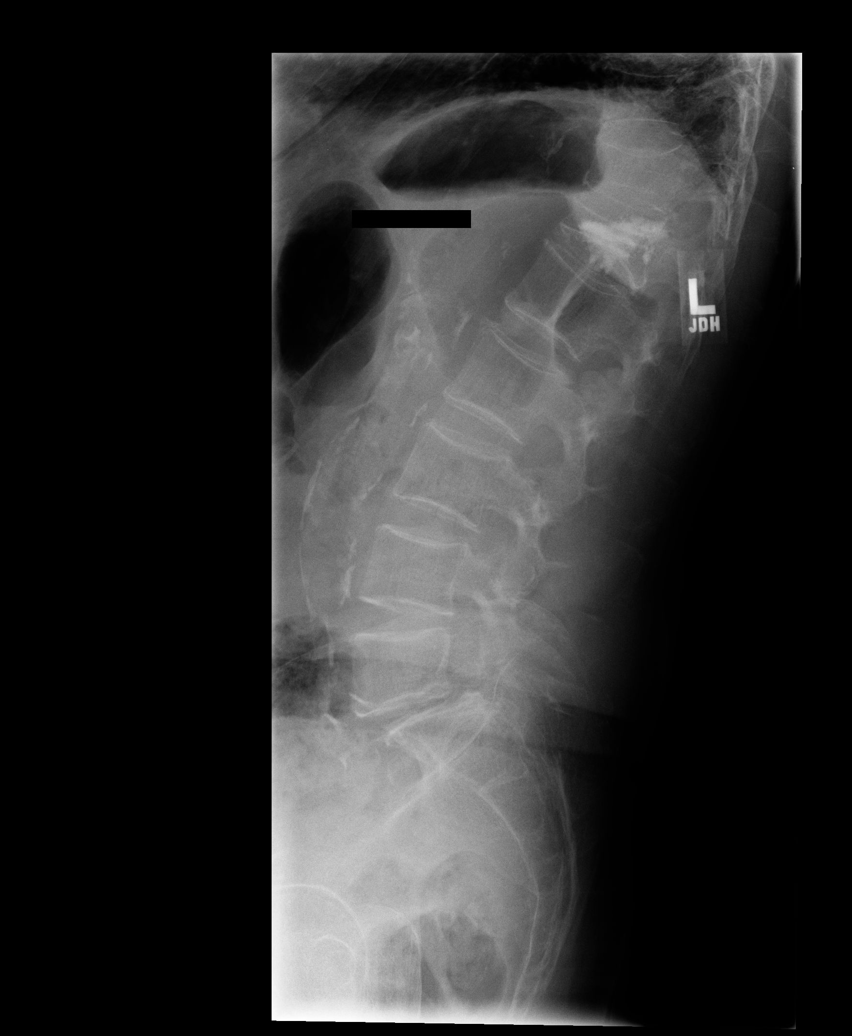

[view not recorded (5 of 5)]
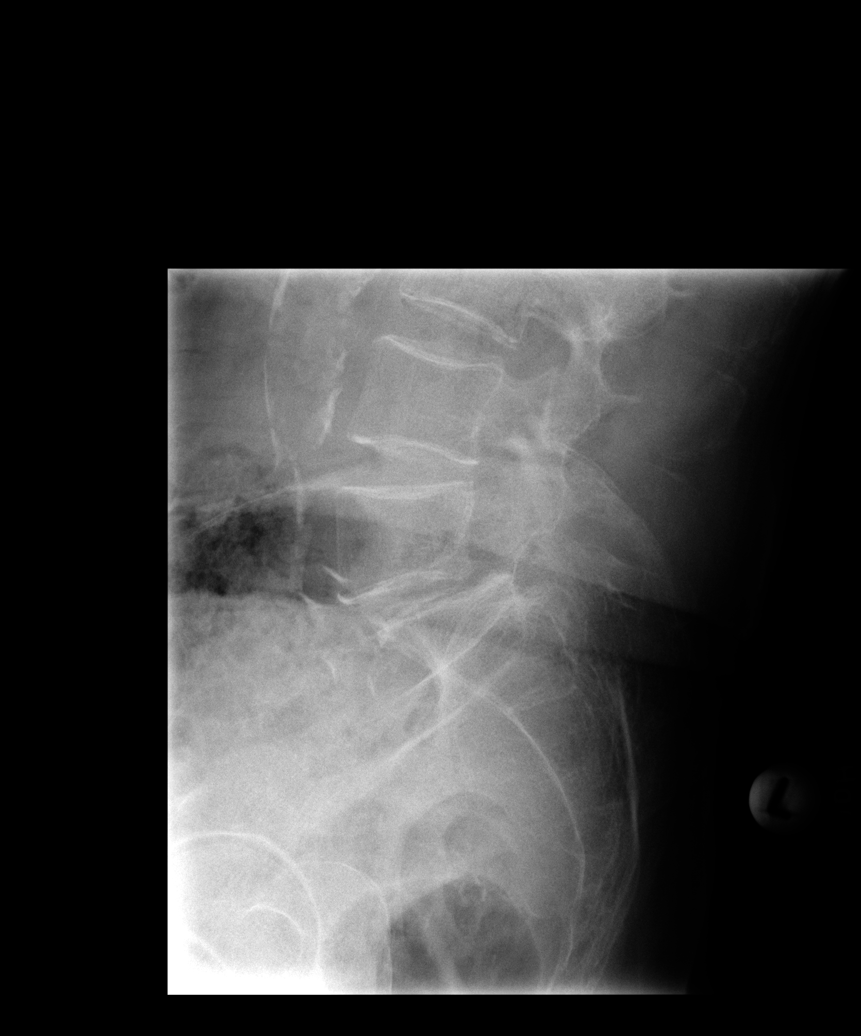

[5 of 5 positions shown; findings below may reference images not displayed]

FINDINGS: Five views of lumbar spine submitted. No acute fracture. There is
about 8 mm anterolisthesis L5 on S1 vertebral body. Atherosclerotic
calcifications of abdominal aorta and iliac arteries. There is
significant compression deformity and prior vertebroplasty at T12
level. Mild compression superior endplate of T11 vertebral body.
Disc space flattening at L5-S1 level.
IMPRESSION: No acute fracture. There is about 8 mm anterolisthesis L5 on S1
vertebral body. Disc space flattening at L5-S1 level. Mild
compression deformity superior endplate of T11 vertebral body. Prior
vertebroplasty at T12 level.

## 2014-09-30 DIAGNOSIS — H4010X3 Unspecified open-angle glaucoma, severe stage: Secondary | ICD-10-CM | POA: Diagnosis not present

## 2014-11-14 DIAGNOSIS — I5030 Unspecified diastolic (congestive) heart failure: Secondary | ICD-10-CM | POA: Diagnosis not present

## 2014-11-14 DIAGNOSIS — E782 Mixed hyperlipidemia: Secondary | ICD-10-CM | POA: Diagnosis not present

## 2014-11-14 DIAGNOSIS — I340 Nonrheumatic mitral (valve) insufficiency: Secondary | ICD-10-CM | POA: Diagnosis not present

## 2014-11-14 DIAGNOSIS — I2510 Atherosclerotic heart disease of native coronary artery without angina pectoris: Secondary | ICD-10-CM | POA: Diagnosis not present

## 2014-12-10 DIAGNOSIS — Z23 Encounter for immunization: Secondary | ICD-10-CM | POA: Diagnosis not present

## 2015-01-05 DIAGNOSIS — I509 Heart failure, unspecified: Secondary | ICD-10-CM | POA: Diagnosis not present

## 2015-01-05 DIAGNOSIS — I130 Hypertensive heart and chronic kidney disease with heart failure and stage 1 through stage 4 chronic kidney disease, or unspecified chronic kidney disease: Secondary | ICD-10-CM | POA: Diagnosis not present

## 2015-01-05 DIAGNOSIS — J180 Bronchopneumonia, unspecified organism: Secondary | ICD-10-CM | POA: Diagnosis not present

## 2015-01-05 DIAGNOSIS — R0902 Hypoxemia: Secondary | ICD-10-CM | POA: Diagnosis not present

## 2015-01-05 DIAGNOSIS — R404 Transient alteration of awareness: Secondary | ICD-10-CM | POA: Diagnosis not present

## 2015-01-05 DIAGNOSIS — I252 Old myocardial infarction: Secondary | ICD-10-CM | POA: Diagnosis not present

## 2015-01-05 DIAGNOSIS — I10 Essential (primary) hypertension: Secondary | ICD-10-CM | POA: Diagnosis not present

## 2015-01-05 DIAGNOSIS — J9601 Acute respiratory failure with hypoxia: Secondary | ICD-10-CM | POA: Diagnosis not present

## 2015-01-05 DIAGNOSIS — I480 Paroxysmal atrial fibrillation: Secondary | ICD-10-CM | POA: Diagnosis not present

## 2015-01-05 DIAGNOSIS — R05 Cough: Secondary | ICD-10-CM | POA: Diagnosis not present

## 2015-01-05 DIAGNOSIS — Z7982 Long term (current) use of aspirin: Secondary | ICD-10-CM | POA: Diagnosis not present

## 2015-01-05 DIAGNOSIS — D631 Anemia in chronic kidney disease: Secondary | ICD-10-CM | POA: Diagnosis present

## 2015-01-05 DIAGNOSIS — I4891 Unspecified atrial fibrillation: Secondary | ICD-10-CM | POA: Diagnosis present

## 2015-01-05 DIAGNOSIS — Z951 Presence of aortocoronary bypass graft: Secondary | ICD-10-CM | POA: Diagnosis not present

## 2015-01-05 DIAGNOSIS — K219 Gastro-esophageal reflux disease without esophagitis: Secondary | ICD-10-CM | POA: Diagnosis present

## 2015-01-05 DIAGNOSIS — R531 Weakness: Secondary | ICD-10-CM | POA: Diagnosis not present

## 2015-01-05 DIAGNOSIS — R54 Age-related physical debility: Secondary | ICD-10-CM | POA: Diagnosis present

## 2015-01-05 DIAGNOSIS — F419 Anxiety disorder, unspecified: Secondary | ICD-10-CM | POA: Diagnosis present

## 2015-01-05 DIAGNOSIS — R0602 Shortness of breath: Secondary | ICD-10-CM | POA: Diagnosis not present

## 2015-01-05 DIAGNOSIS — Z66 Do not resuscitate: Secondary | ICD-10-CM | POA: Diagnosis present

## 2015-01-05 DIAGNOSIS — N183 Chronic kidney disease, stage 3 (moderate): Secondary | ICD-10-CM | POA: Diagnosis not present

## 2015-01-05 DIAGNOSIS — Z8673 Personal history of transient ischemic attack (TIA), and cerebral infarction without residual deficits: Secondary | ICD-10-CM | POA: Diagnosis not present

## 2015-01-05 DIAGNOSIS — I2510 Atherosclerotic heart disease of native coronary artery without angina pectoris: Secondary | ICD-10-CM | POA: Diagnosis not present

## 2015-01-05 DIAGNOSIS — I5023 Acute on chronic systolic (congestive) heart failure: Secondary | ICD-10-CM | POA: Diagnosis present

## 2015-01-05 DIAGNOSIS — Z9861 Coronary angioplasty status: Secondary | ICD-10-CM | POA: Diagnosis not present

## 2015-01-05 DIAGNOSIS — Z87891 Personal history of nicotine dependence: Secondary | ICD-10-CM | POA: Diagnosis not present

## 2015-01-05 DIAGNOSIS — Z79899 Other long term (current) drug therapy: Secondary | ICD-10-CM | POA: Diagnosis not present

## 2015-01-10 DIAGNOSIS — Z9981 Dependence on supplemental oxygen: Secondary | ICD-10-CM | POA: Diagnosis not present

## 2015-01-10 DIAGNOSIS — I5023 Acute on chronic systolic (congestive) heart failure: Secondary | ICD-10-CM | POA: Diagnosis not present

## 2015-01-10 DIAGNOSIS — I429 Cardiomyopathy, unspecified: Secondary | ICD-10-CM | POA: Diagnosis not present

## 2015-01-10 DIAGNOSIS — I130 Hypertensive heart and chronic kidney disease with heart failure and stage 1 through stage 4 chronic kidney disease, or unspecified chronic kidney disease: Secondary | ICD-10-CM | POA: Diagnosis not present

## 2015-01-10 DIAGNOSIS — I4891 Unspecified atrial fibrillation: Secondary | ICD-10-CM | POA: Diagnosis not present

## 2015-01-10 DIAGNOSIS — N183 Chronic kidney disease, stage 3 (moderate): Secondary | ICD-10-CM | POA: Diagnosis not present

## 2015-01-10 DIAGNOSIS — Z951 Presence of aortocoronary bypass graft: Secondary | ICD-10-CM | POA: Diagnosis not present

## 2015-01-10 DIAGNOSIS — D631 Anemia in chronic kidney disease: Secondary | ICD-10-CM | POA: Diagnosis not present

## 2015-01-10 DIAGNOSIS — Z7982 Long term (current) use of aspirin: Secondary | ICD-10-CM | POA: Diagnosis not present

## 2015-01-10 DIAGNOSIS — Z87891 Personal history of nicotine dependence: Secondary | ICD-10-CM | POA: Diagnosis not present

## 2015-01-10 DIAGNOSIS — Z8673 Personal history of transient ischemic attack (TIA), and cerebral infarction without residual deficits: Secondary | ICD-10-CM | POA: Diagnosis not present

## 2015-01-10 DIAGNOSIS — I2510 Atherosclerotic heart disease of native coronary artery without angina pectoris: Secondary | ICD-10-CM | POA: Diagnosis not present

## 2015-01-10 DIAGNOSIS — I252 Old myocardial infarction: Secondary | ICD-10-CM | POA: Diagnosis not present

## 2015-01-13 DIAGNOSIS — N183 Chronic kidney disease, stage 3 (moderate): Secondary | ICD-10-CM | POA: Diagnosis not present

## 2015-01-13 DIAGNOSIS — I5023 Acute on chronic systolic (congestive) heart failure: Secondary | ICD-10-CM | POA: Diagnosis not present

## 2015-01-13 DIAGNOSIS — I2510 Atherosclerotic heart disease of native coronary artery without angina pectoris: Secondary | ICD-10-CM | POA: Diagnosis not present

## 2015-01-13 DIAGNOSIS — I130 Hypertensive heart and chronic kidney disease with heart failure and stage 1 through stage 4 chronic kidney disease, or unspecified chronic kidney disease: Secondary | ICD-10-CM | POA: Diagnosis not present

## 2015-01-13 DIAGNOSIS — D631 Anemia in chronic kidney disease: Secondary | ICD-10-CM | POA: Diagnosis not present

## 2015-01-13 DIAGNOSIS — I4891 Unspecified atrial fibrillation: Secondary | ICD-10-CM | POA: Diagnosis not present

## 2015-01-15 DIAGNOSIS — I4891 Unspecified atrial fibrillation: Secondary | ICD-10-CM | POA: Diagnosis not present

## 2015-01-15 DIAGNOSIS — I5023 Acute on chronic systolic (congestive) heart failure: Secondary | ICD-10-CM | POA: Diagnosis not present

## 2015-01-15 DIAGNOSIS — D631 Anemia in chronic kidney disease: Secondary | ICD-10-CM | POA: Diagnosis not present

## 2015-01-15 DIAGNOSIS — N183 Chronic kidney disease, stage 3 (moderate): Secondary | ICD-10-CM | POA: Diagnosis not present

## 2015-01-15 DIAGNOSIS — I130 Hypertensive heart and chronic kidney disease with heart failure and stage 1 through stage 4 chronic kidney disease, or unspecified chronic kidney disease: Secondary | ICD-10-CM | POA: Diagnosis not present

## 2015-01-15 DIAGNOSIS — I2510 Atherosclerotic heart disease of native coronary artery without angina pectoris: Secondary | ICD-10-CM | POA: Diagnosis not present

## 2015-01-20 DIAGNOSIS — I5023 Acute on chronic systolic (congestive) heart failure: Secondary | ICD-10-CM | POA: Diagnosis not present

## 2015-01-20 DIAGNOSIS — N183 Chronic kidney disease, stage 3 (moderate): Secondary | ICD-10-CM | POA: Diagnosis not present

## 2015-01-20 DIAGNOSIS — I130 Hypertensive heart and chronic kidney disease with heart failure and stage 1 through stage 4 chronic kidney disease, or unspecified chronic kidney disease: Secondary | ICD-10-CM | POA: Diagnosis not present

## 2015-01-20 DIAGNOSIS — I2510 Atherosclerotic heart disease of native coronary artery without angina pectoris: Secondary | ICD-10-CM | POA: Diagnosis not present

## 2015-01-20 DIAGNOSIS — I4891 Unspecified atrial fibrillation: Secondary | ICD-10-CM | POA: Diagnosis not present

## 2015-01-20 DIAGNOSIS — D631 Anemia in chronic kidney disease: Secondary | ICD-10-CM | POA: Diagnosis not present

## 2015-01-23 DIAGNOSIS — N400 Enlarged prostate without lower urinary tract symptoms: Secondary | ICD-10-CM | POA: Diagnosis not present

## 2015-01-23 DIAGNOSIS — D649 Anemia, unspecified: Secondary | ICD-10-CM | POA: Diagnosis not present

## 2015-01-23 DIAGNOSIS — I4891 Unspecified atrial fibrillation: Secondary | ICD-10-CM | POA: Diagnosis not present

## 2015-01-23 DIAGNOSIS — Z79899 Other long term (current) drug therapy: Secondary | ICD-10-CM | POA: Diagnosis not present

## 2015-01-23 DIAGNOSIS — I509 Heart failure, unspecified: Secondary | ICD-10-CM | POA: Diagnosis not present

## 2015-01-23 DIAGNOSIS — H409 Unspecified glaucoma: Secondary | ICD-10-CM | POA: Diagnosis not present

## 2015-01-23 DIAGNOSIS — E785 Hyperlipidemia, unspecified: Secondary | ICD-10-CM | POA: Diagnosis not present

## 2015-01-29 DIAGNOSIS — I130 Hypertensive heart and chronic kidney disease with heart failure and stage 1 through stage 4 chronic kidney disease, or unspecified chronic kidney disease: Secondary | ICD-10-CM | POA: Diagnosis not present

## 2015-01-29 DIAGNOSIS — I4891 Unspecified atrial fibrillation: Secondary | ICD-10-CM | POA: Diagnosis not present

## 2015-01-29 DIAGNOSIS — I2510 Atherosclerotic heart disease of native coronary artery without angina pectoris: Secondary | ICD-10-CM | POA: Diagnosis not present

## 2015-01-29 DIAGNOSIS — N183 Chronic kidney disease, stage 3 (moderate): Secondary | ICD-10-CM | POA: Diagnosis not present

## 2015-01-29 DIAGNOSIS — I5023 Acute on chronic systolic (congestive) heart failure: Secondary | ICD-10-CM | POA: Diagnosis not present

## 2015-01-29 DIAGNOSIS — D631 Anemia in chronic kidney disease: Secondary | ICD-10-CM | POA: Diagnosis not present

## 2015-02-05 DIAGNOSIS — I130 Hypertensive heart and chronic kidney disease with heart failure and stage 1 through stage 4 chronic kidney disease, or unspecified chronic kidney disease: Secondary | ICD-10-CM | POA: Diagnosis not present

## 2015-02-05 DIAGNOSIS — I4891 Unspecified atrial fibrillation: Secondary | ICD-10-CM | POA: Diagnosis not present

## 2015-02-05 DIAGNOSIS — I5023 Acute on chronic systolic (congestive) heart failure: Secondary | ICD-10-CM | POA: Diagnosis not present

## 2015-02-05 DIAGNOSIS — I2510 Atherosclerotic heart disease of native coronary artery without angina pectoris: Secondary | ICD-10-CM | POA: Diagnosis not present

## 2015-02-05 DIAGNOSIS — N183 Chronic kidney disease, stage 3 (moderate): Secondary | ICD-10-CM | POA: Diagnosis not present

## 2015-02-05 DIAGNOSIS — D631 Anemia in chronic kidney disease: Secondary | ICD-10-CM | POA: Diagnosis not present

## 2015-02-10 DIAGNOSIS — N400 Enlarged prostate without lower urinary tract symptoms: Secondary | ICD-10-CM | POA: Diagnosis not present

## 2015-02-10 DIAGNOSIS — I509 Heart failure, unspecified: Secondary | ICD-10-CM | POA: Diagnosis not present

## 2015-02-10 DIAGNOSIS — H409 Unspecified glaucoma: Secondary | ICD-10-CM | POA: Diagnosis not present

## 2015-02-10 DIAGNOSIS — I10 Essential (primary) hypertension: Secondary | ICD-10-CM | POA: Diagnosis not present

## 2015-02-10 DIAGNOSIS — E785 Hyperlipidemia, unspecified: Secondary | ICD-10-CM | POA: Diagnosis not present

## 2015-02-10 DIAGNOSIS — I4891 Unspecified atrial fibrillation: Secondary | ICD-10-CM | POA: Diagnosis not present

## 2015-02-12 DIAGNOSIS — I5023 Acute on chronic systolic (congestive) heart failure: Secondary | ICD-10-CM | POA: Diagnosis not present

## 2015-02-12 DIAGNOSIS — I4891 Unspecified atrial fibrillation: Secondary | ICD-10-CM | POA: Diagnosis not present

## 2015-02-12 DIAGNOSIS — N183 Chronic kidney disease, stage 3 (moderate): Secondary | ICD-10-CM | POA: Diagnosis not present

## 2015-02-12 DIAGNOSIS — I2510 Atherosclerotic heart disease of native coronary artery without angina pectoris: Secondary | ICD-10-CM | POA: Diagnosis not present

## 2015-02-12 DIAGNOSIS — D631 Anemia in chronic kidney disease: Secondary | ICD-10-CM | POA: Diagnosis not present

## 2015-02-12 DIAGNOSIS — I130 Hypertensive heart and chronic kidney disease with heart failure and stage 1 through stage 4 chronic kidney disease, or unspecified chronic kidney disease: Secondary | ICD-10-CM | POA: Diagnosis not present

## 2015-02-13 DIAGNOSIS — Z79899 Other long term (current) drug therapy: Secondary | ICD-10-CM | POA: Diagnosis not present

## 2015-02-13 DIAGNOSIS — D649 Anemia, unspecified: Secondary | ICD-10-CM | POA: Diagnosis not present

## 2015-02-17 DIAGNOSIS — N183 Chronic kidney disease, stage 3 (moderate): Secondary | ICD-10-CM | POA: Diagnosis not present

## 2015-02-17 DIAGNOSIS — I130 Hypertensive heart and chronic kidney disease with heart failure and stage 1 through stage 4 chronic kidney disease, or unspecified chronic kidney disease: Secondary | ICD-10-CM | POA: Diagnosis not present

## 2015-02-17 DIAGNOSIS — I4891 Unspecified atrial fibrillation: Secondary | ICD-10-CM | POA: Diagnosis not present

## 2015-02-17 DIAGNOSIS — I5023 Acute on chronic systolic (congestive) heart failure: Secondary | ICD-10-CM | POA: Diagnosis not present

## 2015-02-17 DIAGNOSIS — D631 Anemia in chronic kidney disease: Secondary | ICD-10-CM | POA: Diagnosis not present

## 2015-02-17 DIAGNOSIS — I2510 Atherosclerotic heart disease of native coronary artery without angina pectoris: Secondary | ICD-10-CM | POA: Diagnosis not present

## 2015-02-20 DIAGNOSIS — D649 Anemia, unspecified: Secondary | ICD-10-CM | POA: Diagnosis not present

## 2015-02-20 DIAGNOSIS — I509 Heart failure, unspecified: Secondary | ICD-10-CM | POA: Diagnosis not present

## 2015-02-20 DIAGNOSIS — I4891 Unspecified atrial fibrillation: Secondary | ICD-10-CM | POA: Diagnosis not present

## 2015-02-20 DIAGNOSIS — I10 Essential (primary) hypertension: Secondary | ICD-10-CM | POA: Diagnosis not present

## 2015-02-20 DIAGNOSIS — E785 Hyperlipidemia, unspecified: Secondary | ICD-10-CM | POA: Diagnosis not present

## 2015-02-24 DIAGNOSIS — E785 Hyperlipidemia, unspecified: Secondary | ICD-10-CM | POA: Diagnosis not present

## 2015-02-24 DIAGNOSIS — I509 Heart failure, unspecified: Secondary | ICD-10-CM | POA: Diagnosis not present

## 2015-02-24 DIAGNOSIS — I10 Essential (primary) hypertension: Secondary | ICD-10-CM | POA: Diagnosis not present

## 2015-03-06 DIAGNOSIS — E785 Hyperlipidemia, unspecified: Secondary | ICD-10-CM | POA: Diagnosis not present

## 2015-03-06 DIAGNOSIS — I4891 Unspecified atrial fibrillation: Secondary | ICD-10-CM | POA: Diagnosis not present

## 2015-03-06 DIAGNOSIS — I509 Heart failure, unspecified: Secondary | ICD-10-CM | POA: Diagnosis not present

## 2015-03-06 DIAGNOSIS — H409 Unspecified glaucoma: Secondary | ICD-10-CM | POA: Diagnosis not present

## 2015-03-06 DIAGNOSIS — N400 Enlarged prostate without lower urinary tract symptoms: Secondary | ICD-10-CM | POA: Diagnosis not present

## 2015-03-06 DIAGNOSIS — I10 Essential (primary) hypertension: Secondary | ICD-10-CM | POA: Diagnosis not present

## 2015-03-06 DIAGNOSIS — D649 Anemia, unspecified: Secondary | ICD-10-CM | POA: Diagnosis not present

## 2015-03-24 DIAGNOSIS — I10 Essential (primary) hypertension: Secondary | ICD-10-CM | POA: Diagnosis not present

## 2015-03-24 DIAGNOSIS — I482 Chronic atrial fibrillation: Secondary | ICD-10-CM | POA: Diagnosis not present

## 2015-03-24 DIAGNOSIS — E785 Hyperlipidemia, unspecified: Secondary | ICD-10-CM | POA: Diagnosis not present

## 2015-03-31 DIAGNOSIS — H5213 Myopia, bilateral: Secondary | ICD-10-CM | POA: Diagnosis not present

## 2015-03-31 DIAGNOSIS — H4010X3 Unspecified open-angle glaucoma, severe stage: Secondary | ICD-10-CM | POA: Diagnosis not present

## 2015-04-21 DIAGNOSIS — J069 Acute upper respiratory infection, unspecified: Secondary | ICD-10-CM | POA: Diagnosis not present

## 2015-04-21 DIAGNOSIS — E785 Hyperlipidemia, unspecified: Secondary | ICD-10-CM | POA: Diagnosis not present

## 2015-04-21 DIAGNOSIS — N184 Chronic kidney disease, stage 4 (severe): Secondary | ICD-10-CM | POA: Diagnosis not present

## 2015-04-21 DIAGNOSIS — I10 Essential (primary) hypertension: Secondary | ICD-10-CM | POA: Diagnosis not present

## 2015-05-29 DIAGNOSIS — I509 Heart failure, unspecified: Secondary | ICD-10-CM | POA: Diagnosis not present

## 2015-05-29 DIAGNOSIS — I482 Chronic atrial fibrillation: Secondary | ICD-10-CM | POA: Diagnosis not present

## 2015-05-29 DIAGNOSIS — N184 Chronic kidney disease, stage 4 (severe): Secondary | ICD-10-CM | POA: Diagnosis not present

## 2015-05-29 DIAGNOSIS — I10 Essential (primary) hypertension: Secondary | ICD-10-CM | POA: Diagnosis not present

## 2015-06-02 DIAGNOSIS — I509 Heart failure, unspecified: Secondary | ICD-10-CM | POA: Diagnosis not present

## 2015-06-02 DIAGNOSIS — I10 Essential (primary) hypertension: Secondary | ICD-10-CM | POA: Diagnosis not present

## 2015-06-02 DIAGNOSIS — I482 Chronic atrial fibrillation: Secondary | ICD-10-CM | POA: Diagnosis not present

## 2015-06-02 DIAGNOSIS — N184 Chronic kidney disease, stage 4 (severe): Secondary | ICD-10-CM | POA: Diagnosis not present

## 2015-06-09 DIAGNOSIS — R05 Cough: Secondary | ICD-10-CM | POA: Diagnosis not present

## 2015-06-09 DIAGNOSIS — I482 Chronic atrial fibrillation: Secondary | ICD-10-CM | POA: Diagnosis not present

## 2015-06-09 DIAGNOSIS — I10 Essential (primary) hypertension: Secondary | ICD-10-CM | POA: Diagnosis not present

## 2015-06-13 DIAGNOSIS — I498 Other specified cardiac arrhythmias: Secondary | ICD-10-CM | POA: Diagnosis not present

## 2015-06-13 DIAGNOSIS — J90 Pleural effusion, not elsewhere classified: Secondary | ICD-10-CM | POA: Diagnosis not present

## 2015-06-16 DIAGNOSIS — J309 Allergic rhinitis, unspecified: Secondary | ICD-10-CM | POA: Diagnosis not present

## 2015-06-16 DIAGNOSIS — N184 Chronic kidney disease, stage 4 (severe): Secondary | ICD-10-CM | POA: Diagnosis not present

## 2015-06-16 DIAGNOSIS — I10 Essential (primary) hypertension: Secondary | ICD-10-CM | POA: Diagnosis not present

## 2015-06-16 DIAGNOSIS — E785 Hyperlipidemia, unspecified: Secondary | ICD-10-CM | POA: Diagnosis not present

## 2015-06-16 DIAGNOSIS — I509 Heart failure, unspecified: Secondary | ICD-10-CM | POA: Diagnosis not present

## 2015-06-16 DIAGNOSIS — I482 Chronic atrial fibrillation: Secondary | ICD-10-CM | POA: Diagnosis not present

## 2015-06-16 DIAGNOSIS — I498 Other specified cardiac arrhythmias: Secondary | ICD-10-CM | POA: Diagnosis not present

## 2015-06-19 DIAGNOSIS — I5020 Unspecified systolic (congestive) heart failure: Secondary | ICD-10-CM | POA: Diagnosis not present

## 2015-06-19 DIAGNOSIS — I4891 Unspecified atrial fibrillation: Secondary | ICD-10-CM | POA: Diagnosis not present

## 2015-06-19 DIAGNOSIS — E782 Mixed hyperlipidemia: Secondary | ICD-10-CM | POA: Diagnosis not present

## 2015-06-19 DIAGNOSIS — I2510 Atherosclerotic heart disease of native coronary artery without angina pectoris: Secondary | ICD-10-CM | POA: Diagnosis not present

## 2015-06-19 DIAGNOSIS — I340 Nonrheumatic mitral (valve) insufficiency: Secondary | ICD-10-CM | POA: Diagnosis not present

## 2015-07-28 DIAGNOSIS — H4010X3 Unspecified open-angle glaucoma, severe stage: Secondary | ICD-10-CM | POA: Diagnosis not present

## 2015-08-04 DIAGNOSIS — I509 Heart failure, unspecified: Secondary | ICD-10-CM | POA: Diagnosis not present

## 2015-08-04 DIAGNOSIS — I10 Essential (primary) hypertension: Secondary | ICD-10-CM | POA: Diagnosis not present

## 2015-08-04 DIAGNOSIS — N184 Chronic kidney disease, stage 4 (severe): Secondary | ICD-10-CM | POA: Diagnosis not present

## 2015-08-04 DIAGNOSIS — E785 Hyperlipidemia, unspecified: Secondary | ICD-10-CM | POA: Diagnosis not present

## 2015-08-04 DIAGNOSIS — I482 Chronic atrial fibrillation: Secondary | ICD-10-CM | POA: Diagnosis not present

## 2015-08-07 DIAGNOSIS — Z79899 Other long term (current) drug therapy: Secondary | ICD-10-CM | POA: Diagnosis not present

## 2015-08-27 DIAGNOSIS — Z79899 Other long term (current) drug therapy: Secondary | ICD-10-CM | POA: Diagnosis not present

## 2015-08-27 DIAGNOSIS — E785 Hyperlipidemia, unspecified: Secondary | ICD-10-CM | POA: Diagnosis not present

## 2015-09-08 DIAGNOSIS — I482 Chronic atrial fibrillation: Secondary | ICD-10-CM | POA: Diagnosis not present

## 2015-09-08 DIAGNOSIS — I10 Essential (primary) hypertension: Secondary | ICD-10-CM | POA: Diagnosis not present

## 2015-09-08 DIAGNOSIS — E785 Hyperlipidemia, unspecified: Secondary | ICD-10-CM | POA: Diagnosis not present

## 2015-09-08 DIAGNOSIS — N184 Chronic kidney disease, stage 4 (severe): Secondary | ICD-10-CM | POA: Diagnosis not present

## 2015-09-08 DIAGNOSIS — K219 Gastro-esophageal reflux disease without esophagitis: Secondary | ICD-10-CM | POA: Diagnosis not present

## 2015-09-08 DIAGNOSIS — I509 Heart failure, unspecified: Secondary | ICD-10-CM | POA: Diagnosis not present

## 2015-09-18 DIAGNOSIS — E782 Mixed hyperlipidemia: Secondary | ICD-10-CM | POA: Diagnosis not present

## 2015-09-18 DIAGNOSIS — I2510 Atherosclerotic heart disease of native coronary artery without angina pectoris: Secondary | ICD-10-CM | POA: Diagnosis not present

## 2015-09-18 DIAGNOSIS — I340 Nonrheumatic mitral (valve) insufficiency: Secondary | ICD-10-CM | POA: Diagnosis not present

## 2015-11-03 DIAGNOSIS — K219 Gastro-esophageal reflux disease without esophagitis: Secondary | ICD-10-CM | POA: Diagnosis not present

## 2015-11-03 DIAGNOSIS — D649 Anemia, unspecified: Secondary | ICD-10-CM | POA: Diagnosis not present

## 2015-11-03 DIAGNOSIS — E785 Hyperlipidemia, unspecified: Secondary | ICD-10-CM | POA: Diagnosis not present

## 2015-11-03 DIAGNOSIS — N184 Chronic kidney disease, stage 4 (severe): Secondary | ICD-10-CM | POA: Diagnosis not present

## 2015-11-03 DIAGNOSIS — I4891 Unspecified atrial fibrillation: Secondary | ICD-10-CM | POA: Diagnosis not present

## 2015-11-03 DIAGNOSIS — I482 Chronic atrial fibrillation: Secondary | ICD-10-CM | POA: Diagnosis not present

## 2015-11-03 DIAGNOSIS — I509 Heart failure, unspecified: Secondary | ICD-10-CM | POA: Diagnosis not present

## 2015-11-03 DIAGNOSIS — I10 Essential (primary) hypertension: Secondary | ICD-10-CM | POA: Diagnosis not present

## 2015-12-05 DIAGNOSIS — Z23 Encounter for immunization: Secondary | ICD-10-CM | POA: Diagnosis not present

## 2015-12-08 DIAGNOSIS — Z79899 Other long term (current) drug therapy: Secondary | ICD-10-CM | POA: Diagnosis not present

## 2015-12-08 DIAGNOSIS — I10 Essential (primary) hypertension: Secondary | ICD-10-CM | POA: Diagnosis not present

## 2016-01-01 DIAGNOSIS — Z79899 Other long term (current) drug therapy: Secondary | ICD-10-CM | POA: Diagnosis not present

## 2016-02-02 DIAGNOSIS — H4010X3 Unspecified open-angle glaucoma, severe stage: Secondary | ICD-10-CM | POA: Diagnosis not present

## 2016-02-20 DIAGNOSIS — Z79899 Other long term (current) drug therapy: Secondary | ICD-10-CM | POA: Diagnosis not present

## 2016-03-12 DIAGNOSIS — I129 Hypertensive chronic kidney disease with stage 1 through stage 4 chronic kidney disease, or unspecified chronic kidney disease: Secondary | ICD-10-CM | POA: Diagnosis not present

## 2016-03-12 DIAGNOSIS — I2510 Atherosclerotic heart disease of native coronary artery without angina pectoris: Secondary | ICD-10-CM | POA: Diagnosis not present

## 2016-03-12 DIAGNOSIS — I4891 Unspecified atrial fibrillation: Secondary | ICD-10-CM | POA: Diagnosis not present

## 2016-03-12 DIAGNOSIS — N183 Chronic kidney disease, stage 3 (moderate): Secondary | ICD-10-CM | POA: Diagnosis not present

## 2016-03-12 DIAGNOSIS — R05 Cough: Secondary | ICD-10-CM | POA: Diagnosis not present

## 2016-03-17 DIAGNOSIS — E119 Type 2 diabetes mellitus without complications: Secondary | ICD-10-CM | POA: Diagnosis not present

## 2016-03-17 DIAGNOSIS — E039 Hypothyroidism, unspecified: Secondary | ICD-10-CM | POA: Diagnosis not present

## 2016-03-17 DIAGNOSIS — E559 Vitamin D deficiency, unspecified: Secondary | ICD-10-CM | POA: Diagnosis not present

## 2016-03-17 DIAGNOSIS — D649 Anemia, unspecified: Secondary | ICD-10-CM | POA: Diagnosis not present

## 2016-03-23 DIAGNOSIS — I5020 Unspecified systolic (congestive) heart failure: Secondary | ICD-10-CM | POA: Diagnosis not present

## 2016-03-23 DIAGNOSIS — I2510 Atherosclerotic heart disease of native coronary artery without angina pectoris: Secondary | ICD-10-CM | POA: Diagnosis not present

## 2016-03-23 DIAGNOSIS — E782 Mixed hyperlipidemia: Secondary | ICD-10-CM | POA: Diagnosis not present

## 2016-03-23 DIAGNOSIS — I340 Nonrheumatic mitral (valve) insufficiency: Secondary | ICD-10-CM | POA: Diagnosis not present

## 2016-03-29 DIAGNOSIS — Z79899 Other long term (current) drug therapy: Secondary | ICD-10-CM | POA: Diagnosis not present

## 2016-03-29 DIAGNOSIS — E785 Hyperlipidemia, unspecified: Secondary | ICD-10-CM | POA: Diagnosis not present

## 2016-06-16 DIAGNOSIS — I509 Heart failure, unspecified: Secondary | ICD-10-CM | POA: Diagnosis not present

## 2016-06-16 DIAGNOSIS — E119 Type 2 diabetes mellitus without complications: Secondary | ICD-10-CM | POA: Diagnosis not present

## 2016-06-16 DIAGNOSIS — I4891 Unspecified atrial fibrillation: Secondary | ICD-10-CM | POA: Diagnosis not present

## 2016-06-16 DIAGNOSIS — I10 Essential (primary) hypertension: Secondary | ICD-10-CM | POA: Diagnosis not present

## 2016-06-25 DIAGNOSIS — I480 Paroxysmal atrial fibrillation: Secondary | ICD-10-CM | POA: Diagnosis not present

## 2016-06-25 DIAGNOSIS — I252 Old myocardial infarction: Secondary | ICD-10-CM | POA: Diagnosis not present

## 2016-06-25 DIAGNOSIS — I130 Hypertensive heart and chronic kidney disease with heart failure and stage 1 through stage 4 chronic kidney disease, or unspecified chronic kidney disease: Secondary | ICD-10-CM | POA: Diagnosis not present

## 2016-06-25 DIAGNOSIS — N189 Chronic kidney disease, unspecified: Secondary | ICD-10-CM | POA: Diagnosis not present

## 2016-06-25 DIAGNOSIS — I249 Acute ischemic heart disease, unspecified: Secondary | ICD-10-CM | POA: Diagnosis not present

## 2016-06-25 DIAGNOSIS — H409 Unspecified glaucoma: Secondary | ICD-10-CM | POA: Diagnosis not present

## 2016-06-25 DIAGNOSIS — Z79899 Other long term (current) drug therapy: Secondary | ICD-10-CM | POA: Diagnosis not present

## 2016-06-25 DIAGNOSIS — Z66 Do not resuscitate: Secondary | ICD-10-CM | POA: Diagnosis not present

## 2016-06-25 DIAGNOSIS — E785 Hyperlipidemia, unspecified: Secondary | ICD-10-CM | POA: Diagnosis not present

## 2016-06-25 DIAGNOSIS — R12 Heartburn: Secondary | ICD-10-CM | POA: Diagnosis not present

## 2016-06-25 DIAGNOSIS — I2510 Atherosclerotic heart disease of native coronary artery without angina pectoris: Secondary | ICD-10-CM | POA: Diagnosis not present

## 2016-06-25 DIAGNOSIS — D631 Anemia in chronic kidney disease: Secondary | ICD-10-CM | POA: Diagnosis not present

## 2016-06-25 DIAGNOSIS — Z87891 Personal history of nicotine dependence: Secondary | ICD-10-CM | POA: Diagnosis not present

## 2016-06-25 DIAGNOSIS — Z7982 Long term (current) use of aspirin: Secondary | ICD-10-CM | POA: Diagnosis not present

## 2016-06-25 DIAGNOSIS — R0789 Other chest pain: Secondary | ICD-10-CM | POA: Diagnosis not present

## 2016-06-25 DIAGNOSIS — R079 Chest pain, unspecified: Secondary | ICD-10-CM | POA: Diagnosis not present

## 2016-06-25 DIAGNOSIS — K219 Gastro-esophageal reflux disease without esophagitis: Secondary | ICD-10-CM | POA: Diagnosis not present

## 2016-06-25 DIAGNOSIS — N184 Chronic kidney disease, stage 4 (severe): Secondary | ICD-10-CM | POA: Diagnosis not present

## 2016-06-25 DIAGNOSIS — I5022 Chronic systolic (congestive) heart failure: Secondary | ICD-10-CM | POA: Diagnosis not present

## 2016-06-25 DIAGNOSIS — I10 Essential (primary) hypertension: Secondary | ICD-10-CM | POA: Diagnosis not present

## 2016-06-25 DIAGNOSIS — I4891 Unspecified atrial fibrillation: Secondary | ICD-10-CM | POA: Diagnosis not present

## 2016-06-25 DIAGNOSIS — Z8673 Personal history of transient ischemic attack (TIA), and cerebral infarction without residual deficits: Secondary | ICD-10-CM | POA: Diagnosis not present

## 2016-06-25 DIAGNOSIS — I110 Hypertensive heart disease with heart failure: Secondary | ICD-10-CM | POA: Diagnosis not present

## 2016-06-25 DIAGNOSIS — Z955 Presence of coronary angioplasty implant and graft: Secondary | ICD-10-CM | POA: Diagnosis not present

## 2016-06-25 DIAGNOSIS — M1990 Unspecified osteoarthritis, unspecified site: Secondary | ICD-10-CM | POA: Diagnosis not present

## 2016-06-25 DIAGNOSIS — I5033 Acute on chronic diastolic (congestive) heart failure: Secondary | ICD-10-CM | POA: Diagnosis not present

## 2016-06-26 DIAGNOSIS — Z66 Do not resuscitate: Secondary | ICD-10-CM | POA: Diagnosis not present

## 2016-06-26 DIAGNOSIS — I10 Essential (primary) hypertension: Secondary | ICD-10-CM | POA: Diagnosis not present

## 2016-06-26 DIAGNOSIS — E785 Hyperlipidemia, unspecified: Secondary | ICD-10-CM | POA: Diagnosis not present

## 2016-06-26 DIAGNOSIS — I2510 Atherosclerotic heart disease of native coronary artery without angina pectoris: Secondary | ICD-10-CM | POA: Diagnosis not present

## 2016-06-26 DIAGNOSIS — I5022 Chronic systolic (congestive) heart failure: Secondary | ICD-10-CM | POA: Diagnosis not present

## 2016-06-26 DIAGNOSIS — N189 Chronic kidney disease, unspecified: Secondary | ICD-10-CM | POA: Diagnosis not present

## 2016-06-26 DIAGNOSIS — R079 Chest pain, unspecified: Secondary | ICD-10-CM | POA: Diagnosis not present

## 2016-06-26 DIAGNOSIS — N184 Chronic kidney disease, stage 4 (severe): Secondary | ICD-10-CM | POA: Diagnosis not present

## 2016-06-26 DIAGNOSIS — H409 Unspecified glaucoma: Secondary | ICD-10-CM | POA: Diagnosis not present

## 2016-06-26 DIAGNOSIS — K219 Gastro-esophageal reflux disease without esophagitis: Secondary | ICD-10-CM | POA: Diagnosis not present

## 2016-06-26 DIAGNOSIS — I480 Paroxysmal atrial fibrillation: Secondary | ICD-10-CM | POA: Diagnosis not present

## 2016-06-29 DIAGNOSIS — R54 Age-related physical debility: Secondary | ICD-10-CM | POA: Diagnosis not present

## 2016-06-29 DIAGNOSIS — D631 Anemia in chronic kidney disease: Secondary | ICD-10-CM | POA: Diagnosis not present

## 2016-06-29 DIAGNOSIS — N184 Chronic kidney disease, stage 4 (severe): Secondary | ICD-10-CM | POA: Diagnosis not present

## 2016-06-29 DIAGNOSIS — I4891 Unspecified atrial fibrillation: Secondary | ICD-10-CM | POA: Diagnosis not present

## 2016-06-29 DIAGNOSIS — I2510 Atherosclerotic heart disease of native coronary artery without angina pectoris: Secondary | ICD-10-CM | POA: Diagnosis not present

## 2016-06-29 DIAGNOSIS — I129 Hypertensive chronic kidney disease with stage 1 through stage 4 chronic kidney disease, or unspecified chronic kidney disease: Secondary | ICD-10-CM | POA: Diagnosis not present

## 2016-06-29 DIAGNOSIS — I5022 Chronic systolic (congestive) heart failure: Secondary | ICD-10-CM | POA: Diagnosis not present

## 2016-07-08 DIAGNOSIS — R0789 Other chest pain: Secondary | ICD-10-CM | POA: Diagnosis not present

## 2016-07-08 DIAGNOSIS — R296 Repeated falls: Secondary | ICD-10-CM | POA: Diagnosis not present

## 2016-07-08 DIAGNOSIS — R0600 Dyspnea, unspecified: Secondary | ICD-10-CM | POA: Diagnosis not present

## 2016-07-08 DIAGNOSIS — R54 Age-related physical debility: Secondary | ICD-10-CM | POA: Diagnosis not present

## 2016-07-08 DIAGNOSIS — G3184 Mild cognitive impairment, so stated: Secondary | ICD-10-CM | POA: Diagnosis not present

## 2016-07-08 DIAGNOSIS — R52 Pain, unspecified: Secondary | ICD-10-CM | POA: Diagnosis not present

## 2016-07-08 DIAGNOSIS — R5381 Other malaise: Secondary | ICD-10-CM | POA: Diagnosis not present

## 2016-08-30 DIAGNOSIS — H4010X3 Unspecified open-angle glaucoma, severe stage: Secondary | ICD-10-CM | POA: Diagnosis not present

## 2016-10-18 DIAGNOSIS — I779 Disorder of arteries and arterioles, unspecified: Secondary | ICD-10-CM | POA: Diagnosis not present

## 2016-10-18 DIAGNOSIS — I252 Old myocardial infarction: Secondary | ICD-10-CM | POA: Diagnosis not present

## 2016-10-18 DIAGNOSIS — I340 Nonrheumatic mitral (valve) insufficiency: Secondary | ICD-10-CM | POA: Diagnosis not present

## 2016-10-18 DIAGNOSIS — R001 Bradycardia, unspecified: Secondary | ICD-10-CM | POA: Diagnosis not present

## 2016-10-18 DIAGNOSIS — Z9861 Coronary angioplasty status: Secondary | ICD-10-CM | POA: Diagnosis not present

## 2016-10-18 DIAGNOSIS — I2510 Atherosclerotic heart disease of native coronary artery without angina pectoris: Secondary | ICD-10-CM | POA: Diagnosis not present

## 2016-10-18 DIAGNOSIS — Z955 Presence of coronary angioplasty implant and graft: Secondary | ICD-10-CM | POA: Diagnosis not present

## 2016-10-18 DIAGNOSIS — E782 Mixed hyperlipidemia: Secondary | ICD-10-CM | POA: Diagnosis not present

## 2016-10-20 DIAGNOSIS — G3184 Mild cognitive impairment, so stated: Secondary | ICD-10-CM | POA: Diagnosis not present

## 2016-10-20 DIAGNOSIS — E119 Type 2 diabetes mellitus without complications: Secondary | ICD-10-CM | POA: Diagnosis not present

## 2016-10-20 DIAGNOSIS — R001 Bradycardia, unspecified: Secondary | ICD-10-CM | POA: Diagnosis not present

## 2016-10-20 DIAGNOSIS — I10 Essential (primary) hypertension: Secondary | ICD-10-CM | POA: Diagnosis not present

## 2016-10-20 DIAGNOSIS — I4891 Unspecified atrial fibrillation: Secondary | ICD-10-CM | POA: Diagnosis not present

## 2016-11-11 ENCOUNTER — Other Ambulatory Visit: Payer: Self-pay | Admitting: Cardiology

## 2016-11-11 DIAGNOSIS — D72829 Elevated white blood cell count, unspecified: Secondary | ICD-10-CM

## 2016-11-11 DIAGNOSIS — N189 Chronic kidney disease, unspecified: Secondary | ICD-10-CM

## 2016-11-11 DIAGNOSIS — R634 Abnormal weight loss: Secondary | ICD-10-CM

## 2016-11-11 DIAGNOSIS — Z87891 Personal history of nicotine dependence: Secondary | ICD-10-CM

## 2016-11-11 DIAGNOSIS — D631 Anemia in chronic kidney disease: Secondary | ICD-10-CM

## 2016-11-19 ENCOUNTER — Other Ambulatory Visit: Payer: Self-pay | Admitting: Cardiology

## 2016-11-19 DIAGNOSIS — Z87891 Personal history of nicotine dependence: Secondary | ICD-10-CM

## 2016-11-19 DIAGNOSIS — N189 Chronic kidney disease, unspecified: Secondary | ICD-10-CM

## 2016-11-19 DIAGNOSIS — R634 Abnormal weight loss: Secondary | ICD-10-CM

## 2016-11-19 DIAGNOSIS — D72829 Elevated white blood cell count, unspecified: Secondary | ICD-10-CM

## 2016-11-19 DIAGNOSIS — D631 Anemia in chronic kidney disease: Secondary | ICD-10-CM

## 2016-12-02 DIAGNOSIS — Z23 Encounter for immunization: Secondary | ICD-10-CM | POA: Diagnosis not present

## 2016-12-03 DIAGNOSIS — I4891 Unspecified atrial fibrillation: Secondary | ICD-10-CM | POA: Diagnosis not present

## 2016-12-03 DIAGNOSIS — I5022 Chronic systolic (congestive) heart failure: Secondary | ICD-10-CM | POA: Diagnosis not present

## 2016-12-03 DIAGNOSIS — I129 Hypertensive chronic kidney disease with stage 1 through stage 4 chronic kidney disease, or unspecified chronic kidney disease: Secondary | ICD-10-CM | POA: Diagnosis not present

## 2016-12-03 DIAGNOSIS — N184 Chronic kidney disease, stage 4 (severe): Secondary | ICD-10-CM | POA: Diagnosis not present

## 2016-12-03 DIAGNOSIS — D631 Anemia in chronic kidney disease: Secondary | ICD-10-CM | POA: Diagnosis not present

## 2016-12-06 DIAGNOSIS — D649 Anemia, unspecified: Secondary | ICD-10-CM | POA: Diagnosis not present

## 2016-12-06 DIAGNOSIS — E119 Type 2 diabetes mellitus without complications: Secondary | ICD-10-CM | POA: Diagnosis not present

## 2016-12-06 DIAGNOSIS — E559 Vitamin D deficiency, unspecified: Secondary | ICD-10-CM | POA: Diagnosis not present

## 2016-12-23 ENCOUNTER — Other Ambulatory Visit: Payer: Self-pay | Admitting: Cardiology

## 2016-12-27 DIAGNOSIS — H4010X3 Unspecified open-angle glaucoma, severe stage: Secondary | ICD-10-CM | POA: Diagnosis not present

## 2017-02-14 ENCOUNTER — Other Ambulatory Visit: Payer: Self-pay | Admitting: Cardiology

## 2017-03-10 DIAGNOSIS — I509 Heart failure, unspecified: Secondary | ICD-10-CM | POA: Diagnosis not present

## 2017-03-10 DIAGNOSIS — R600 Localized edema: Secondary | ICD-10-CM | POA: Diagnosis not present

## 2017-03-10 DIAGNOSIS — R54 Age-related physical debility: Secondary | ICD-10-CM | POA: Diagnosis not present

## 2017-03-23 DIAGNOSIS — I4891 Unspecified atrial fibrillation: Secondary | ICD-10-CM | POA: Diagnosis not present

## 2017-03-23 DIAGNOSIS — I10 Essential (primary) hypertension: Secondary | ICD-10-CM | POA: Diagnosis not present

## 2017-03-23 DIAGNOSIS — I509 Heart failure, unspecified: Secondary | ICD-10-CM | POA: Diagnosis not present

## 2017-03-23 DIAGNOSIS — N401 Enlarged prostate with lower urinary tract symptoms: Secondary | ICD-10-CM | POA: Diagnosis not present

## 2017-04-18 DIAGNOSIS — E782 Mixed hyperlipidemia: Secondary | ICD-10-CM | POA: Diagnosis not present

## 2017-04-18 DIAGNOSIS — Z955 Presence of coronary angioplasty implant and graft: Secondary | ICD-10-CM | POA: Diagnosis not present

## 2017-04-18 DIAGNOSIS — I480 Paroxysmal atrial fibrillation: Secondary | ICD-10-CM | POA: Diagnosis not present

## 2017-04-18 DIAGNOSIS — Z9861 Coronary angioplasty status: Secondary | ICD-10-CM | POA: Diagnosis not present

## 2017-04-18 DIAGNOSIS — I252 Old myocardial infarction: Secondary | ICD-10-CM | POA: Diagnosis not present

## 2017-04-18 DIAGNOSIS — R011 Cardiac murmur, unspecified: Secondary | ICD-10-CM | POA: Diagnosis not present

## 2017-04-18 DIAGNOSIS — I2510 Atherosclerotic heart disease of native coronary artery without angina pectoris: Secondary | ICD-10-CM | POA: Diagnosis not present

## 2017-05-06 DIAGNOSIS — R011 Cardiac murmur, unspecified: Secondary | ICD-10-CM | POA: Diagnosis not present

## 2017-05-30 DIAGNOSIS — H4010X3 Unspecified open-angle glaucoma, severe stage: Secondary | ICD-10-CM | POA: Diagnosis not present

## 2017-06-23 DIAGNOSIS — I10 Essential (primary) hypertension: Secondary | ICD-10-CM | POA: Diagnosis not present

## 2017-06-23 DIAGNOSIS — K219 Gastro-esophageal reflux disease without esophagitis: Secondary | ICD-10-CM | POA: Diagnosis not present

## 2017-06-23 DIAGNOSIS — J069 Acute upper respiratory infection, unspecified: Secondary | ICD-10-CM | POA: Diagnosis not present

## 2017-06-23 DIAGNOSIS — R0989 Other specified symptoms and signs involving the circulatory and respiratory systems: Secondary | ICD-10-CM | POA: Diagnosis not present

## 2017-06-23 DIAGNOSIS — R509 Fever, unspecified: Secondary | ICD-10-CM | POA: Diagnosis not present

## 2017-06-23 DIAGNOSIS — R05 Cough: Secondary | ICD-10-CM | POA: Diagnosis not present

## 2017-06-23 DIAGNOSIS — R5381 Other malaise: Secondary | ICD-10-CM | POA: Diagnosis not present

## 2017-06-23 DIAGNOSIS — R54 Age-related physical debility: Secondary | ICD-10-CM | POA: Diagnosis not present

## 2017-06-28 DIAGNOSIS — R509 Fever, unspecified: Secondary | ICD-10-CM | POA: Diagnosis not present

## 2017-06-28 DIAGNOSIS — J189 Pneumonia, unspecified organism: Secondary | ICD-10-CM | POA: Diagnosis not present

## 2017-06-28 DIAGNOSIS — R05 Cough: Secondary | ICD-10-CM | POA: Diagnosis not present

## 2017-06-28 DIAGNOSIS — M6281 Muscle weakness (generalized): Secondary | ICD-10-CM | POA: Diagnosis not present

## 2017-06-28 DIAGNOSIS — R918 Other nonspecific abnormal finding of lung field: Secondary | ICD-10-CM | POA: Diagnosis not present

## 2017-07-20 DIAGNOSIS — K219 Gastro-esophageal reflux disease without esophagitis: Secondary | ICD-10-CM | POA: Diagnosis not present

## 2017-07-20 DIAGNOSIS — F0390 Unspecified dementia without behavioral disturbance: Secondary | ICD-10-CM | POA: Diagnosis not present

## 2017-07-20 DIAGNOSIS — E119 Type 2 diabetes mellitus without complications: Secondary | ICD-10-CM | POA: Diagnosis not present

## 2017-07-20 DIAGNOSIS — E785 Hyperlipidemia, unspecified: Secondary | ICD-10-CM | POA: Diagnosis not present

## 2017-07-20 DIAGNOSIS — I509 Heart failure, unspecified: Secondary | ICD-10-CM | POA: Diagnosis not present

## 2017-07-20 DIAGNOSIS — I10 Essential (primary) hypertension: Secondary | ICD-10-CM | POA: Diagnosis not present

## 2017-07-20 DIAGNOSIS — I482 Chronic atrial fibrillation: Secondary | ICD-10-CM | POA: Diagnosis not present

## 2017-07-21 DIAGNOSIS — Z79899 Other long term (current) drug therapy: Secondary | ICD-10-CM | POA: Diagnosis not present

## 2017-07-21 DIAGNOSIS — E785 Hyperlipidemia, unspecified: Secondary | ICD-10-CM | POA: Diagnosis not present

## 2017-07-21 DIAGNOSIS — I10 Essential (primary) hypertension: Secondary | ICD-10-CM | POA: Diagnosis not present

## 2017-07-21 DIAGNOSIS — D649 Anemia, unspecified: Secondary | ICD-10-CM | POA: Diagnosis not present

## 2017-07-21 DIAGNOSIS — E119 Type 2 diabetes mellitus without complications: Secondary | ICD-10-CM | POA: Diagnosis not present

## 2017-09-26 DIAGNOSIS — H4010X3 Unspecified open-angle glaucoma, severe stage: Secondary | ICD-10-CM | POA: Diagnosis not present

## 2017-10-05 DIAGNOSIS — R2681 Unsteadiness on feet: Secondary | ICD-10-CM | POA: Diagnosis not present

## 2017-10-05 DIAGNOSIS — M629 Disorder of muscle, unspecified: Secondary | ICD-10-CM | POA: Diagnosis not present

## 2017-10-05 DIAGNOSIS — R5381 Other malaise: Secondary | ICD-10-CM | POA: Diagnosis not present

## 2017-10-17 DIAGNOSIS — F0390 Unspecified dementia without behavioral disturbance: Secondary | ICD-10-CM | POA: Diagnosis not present

## 2017-10-17 DIAGNOSIS — N183 Chronic kidney disease, stage 3 (moderate): Secondary | ICD-10-CM | POA: Diagnosis not present

## 2017-10-17 DIAGNOSIS — R54 Age-related physical debility: Secondary | ICD-10-CM | POA: Diagnosis not present

## 2017-10-17 DIAGNOSIS — I129 Hypertensive chronic kidney disease with stage 1 through stage 4 chronic kidney disease, or unspecified chronic kidney disease: Secondary | ICD-10-CM | POA: Diagnosis not present

## 2017-11-02 DIAGNOSIS — D649 Anemia, unspecified: Secondary | ICD-10-CM | POA: Diagnosis not present

## 2017-11-02 DIAGNOSIS — I509 Heart failure, unspecified: Secondary | ICD-10-CM | POA: Diagnosis not present

## 2017-11-02 DIAGNOSIS — I10 Essential (primary) hypertension: Secondary | ICD-10-CM | POA: Diagnosis not present

## 2017-11-02 DIAGNOSIS — K219 Gastro-esophageal reflux disease without esophagitis: Secondary | ICD-10-CM | POA: Diagnosis not present

## 2017-11-02 DIAGNOSIS — E785 Hyperlipidemia, unspecified: Secondary | ICD-10-CM | POA: Diagnosis not present

## 2017-11-02 DIAGNOSIS — N400 Enlarged prostate without lower urinary tract symptoms: Secondary | ICD-10-CM | POA: Diagnosis not present

## 2017-11-02 DIAGNOSIS — H409 Unspecified glaucoma: Secondary | ICD-10-CM | POA: Diagnosis not present

## 2017-11-02 DIAGNOSIS — E559 Vitamin D deficiency, unspecified: Secondary | ICD-10-CM | POA: Diagnosis not present

## 2017-11-02 DIAGNOSIS — E876 Hypokalemia: Secondary | ICD-10-CM | POA: Diagnosis not present

## 2017-11-02 DIAGNOSIS — F0390 Unspecified dementia without behavioral disturbance: Secondary | ICD-10-CM | POA: Diagnosis not present

## 2017-11-02 DIAGNOSIS — I4891 Unspecified atrial fibrillation: Secondary | ICD-10-CM | POA: Diagnosis not present

## 2017-11-04 DIAGNOSIS — I252 Old myocardial infarction: Secondary | ICD-10-CM | POA: Diagnosis not present

## 2017-11-04 DIAGNOSIS — I2510 Atherosclerotic heart disease of native coronary artery without angina pectoris: Secondary | ICD-10-CM | POA: Diagnosis not present

## 2017-11-04 DIAGNOSIS — Z955 Presence of coronary angioplasty implant and graft: Secondary | ICD-10-CM | POA: Diagnosis not present

## 2017-11-04 DIAGNOSIS — I480 Paroxysmal atrial fibrillation: Secondary | ICD-10-CM | POA: Diagnosis not present

## 2017-11-04 DIAGNOSIS — Z9861 Coronary angioplasty status: Secondary | ICD-10-CM | POA: Diagnosis not present

## 2017-11-04 DIAGNOSIS — I340 Nonrheumatic mitral (valve) insufficiency: Secondary | ICD-10-CM | POA: Diagnosis not present

## 2017-11-04 DIAGNOSIS — E782 Mixed hyperlipidemia: Secondary | ICD-10-CM | POA: Diagnosis not present

## 2017-11-15 DIAGNOSIS — Z23 Encounter for immunization: Secondary | ICD-10-CM | POA: Diagnosis not present

## 2017-12-06 DIAGNOSIS — F0390 Unspecified dementia without behavioral disturbance: Secondary | ICD-10-CM | POA: Diagnosis not present

## 2017-12-06 DIAGNOSIS — N183 Chronic kidney disease, stage 3 (moderate): Secondary | ICD-10-CM | POA: Diagnosis not present

## 2017-12-06 DIAGNOSIS — I129 Hypertensive chronic kidney disease with stage 1 through stage 4 chronic kidney disease, or unspecified chronic kidney disease: Secondary | ICD-10-CM | POA: Diagnosis not present

## 2018-01-25 DIAGNOSIS — N184 Chronic kidney disease, stage 4 (severe): Secondary | ICD-10-CM | POA: Diagnosis not present

## 2018-01-25 DIAGNOSIS — F0390 Unspecified dementia without behavioral disturbance: Secondary | ICD-10-CM | POA: Diagnosis not present

## 2018-01-25 DIAGNOSIS — I5020 Unspecified systolic (congestive) heart failure: Secondary | ICD-10-CM | POA: Diagnosis not present

## 2018-01-25 DIAGNOSIS — D649 Anemia, unspecified: Secondary | ICD-10-CM | POA: Diagnosis not present

## 2018-01-25 DIAGNOSIS — I4891 Unspecified atrial fibrillation: Secondary | ICD-10-CM | POA: Diagnosis not present

## 2018-01-25 DIAGNOSIS — E785 Hyperlipidemia, unspecified: Secondary | ICD-10-CM | POA: Diagnosis not present

## 2018-01-25 DIAGNOSIS — K219 Gastro-esophageal reflux disease without esophagitis: Secondary | ICD-10-CM | POA: Diagnosis not present

## 2018-01-25 DIAGNOSIS — E119 Type 2 diabetes mellitus without complications: Secondary | ICD-10-CM | POA: Diagnosis not present

## 2018-01-25 DIAGNOSIS — I2510 Atherosclerotic heart disease of native coronary artery without angina pectoris: Secondary | ICD-10-CM | POA: Diagnosis not present

## 2018-01-25 DIAGNOSIS — I10 Essential (primary) hypertension: Secondary | ICD-10-CM | POA: Diagnosis not present

## 2018-01-25 DIAGNOSIS — E559 Vitamin D deficiency, unspecified: Secondary | ICD-10-CM | POA: Diagnosis not present

## 2018-01-27 DIAGNOSIS — D649 Anemia, unspecified: Secondary | ICD-10-CM | POA: Diagnosis not present

## 2018-01-27 DIAGNOSIS — I10 Essential (primary) hypertension: Secondary | ICD-10-CM | POA: Diagnosis not present

## 2018-01-27 DIAGNOSIS — Z79899 Other long term (current) drug therapy: Secondary | ICD-10-CM | POA: Diagnosis not present

## 2018-01-30 DIAGNOSIS — H4010X3 Unspecified open-angle glaucoma, severe stage: Secondary | ICD-10-CM | POA: Diagnosis not present

## 2018-02-24 DIAGNOSIS — I129 Hypertensive chronic kidney disease with stage 1 through stage 4 chronic kidney disease, or unspecified chronic kidney disease: Secondary | ICD-10-CM | POA: Diagnosis not present

## 2018-02-24 DIAGNOSIS — R5381 Other malaise: Secondary | ICD-10-CM | POA: Diagnosis not present

## 2018-02-24 DIAGNOSIS — N183 Chronic kidney disease, stage 3 (moderate): Secondary | ICD-10-CM | POA: Diagnosis not present

## 2018-02-24 DIAGNOSIS — F0390 Unspecified dementia without behavioral disturbance: Secondary | ICD-10-CM | POA: Diagnosis not present

## 2018-03-15 DIAGNOSIS — R54 Age-related physical debility: Secondary | ICD-10-CM | POA: Diagnosis not present

## 2018-03-15 DIAGNOSIS — I10 Essential (primary) hypertension: Secondary | ICD-10-CM | POA: Diagnosis not present

## 2018-03-15 DIAGNOSIS — R001 Bradycardia, unspecified: Secondary | ICD-10-CM | POA: Diagnosis not present

## 2018-03-27 DIAGNOSIS — I959 Hypotension, unspecified: Secondary | ICD-10-CM | POA: Diagnosis not present

## 2018-03-27 DIAGNOSIS — N183 Chronic kidney disease, stage 3 (moderate): Secondary | ICD-10-CM | POA: Diagnosis not present

## 2018-03-27 DIAGNOSIS — I129 Hypertensive chronic kidney disease with stage 1 through stage 4 chronic kidney disease, or unspecified chronic kidney disease: Secondary | ICD-10-CM | POA: Diagnosis not present

## 2018-03-27 DIAGNOSIS — R5381 Other malaise: Secondary | ICD-10-CM | POA: Diagnosis not present

## 2018-03-27 DIAGNOSIS — F0390 Unspecified dementia without behavioral disturbance: Secondary | ICD-10-CM | POA: Diagnosis not present

## 2018-05-16 DIAGNOSIS — F0391 Unspecified dementia with behavioral disturbance: Secondary | ICD-10-CM | POA: Diagnosis not present

## 2018-05-16 DIAGNOSIS — R0989 Other specified symptoms and signs involving the circulatory and respiratory systems: Secondary | ICD-10-CM | POA: Diagnosis not present

## 2018-05-16 DIAGNOSIS — R0689 Other abnormalities of breathing: Secondary | ICD-10-CM | POA: Diagnosis not present

## 2018-05-16 DIAGNOSIS — R531 Weakness: Secondary | ICD-10-CM | POA: Diagnosis not present

## 2018-05-17 DIAGNOSIS — R54 Age-related physical debility: Secondary | ICD-10-CM | POA: Diagnosis not present

## 2018-05-17 DIAGNOSIS — J189 Pneumonia, unspecified organism: Secondary | ICD-10-CM | POA: Diagnosis not present

## 2018-05-17 DIAGNOSIS — R05 Cough: Secondary | ICD-10-CM | POA: Diagnosis not present

## 2018-05-23 DIAGNOSIS — M6281 Muscle weakness (generalized): Secondary | ICD-10-CM | POA: Diagnosis not present

## 2018-05-23 DIAGNOSIS — R630 Anorexia: Secondary | ICD-10-CM | POA: Diagnosis not present

## 2018-05-23 DIAGNOSIS — R5381 Other malaise: Secondary | ICD-10-CM | POA: Diagnosis not present

## 2018-05-23 DIAGNOSIS — J189 Pneumonia, unspecified organism: Secondary | ICD-10-CM | POA: Diagnosis not present

## 2018-05-23 DIAGNOSIS — R0989 Other specified symptoms and signs involving the circulatory and respiratory systems: Secondary | ICD-10-CM | POA: Diagnosis not present

## 2018-05-23 DIAGNOSIS — R05 Cough: Secondary | ICD-10-CM | POA: Diagnosis not present

## 2018-05-24 DIAGNOSIS — J90 Pleural effusion, not elsewhere classified: Secondary | ICD-10-CM | POA: Diagnosis not present

## 2018-05-24 DIAGNOSIS — R5381 Other malaise: Secondary | ICD-10-CM | POA: Diagnosis not present

## 2018-05-24 DIAGNOSIS — J189 Pneumonia, unspecified organism: Secondary | ICD-10-CM | POA: Diagnosis not present

## 2018-06-01 DIAGNOSIS — R630 Anorexia: Secondary | ICD-10-CM | POA: Diagnosis not present

## 2018-06-01 DIAGNOSIS — M6281 Muscle weakness (generalized): Secondary | ICD-10-CM | POA: Diagnosis not present

## 2018-06-01 DIAGNOSIS — I4891 Unspecified atrial fibrillation: Secondary | ICD-10-CM | POA: Diagnosis not present

## 2018-06-01 DIAGNOSIS — F0391 Unspecified dementia with behavioral disturbance: Secondary | ICD-10-CM | POA: Diagnosis not present

## 2018-06-01 DIAGNOSIS — N184 Chronic kidney disease, stage 4 (severe): Secondary | ICD-10-CM | POA: Diagnosis not present

## 2018-06-01 DIAGNOSIS — I2510 Atherosclerotic heart disease of native coronary artery without angina pectoris: Secondary | ICD-10-CM | POA: Diagnosis not present

## 2018-06-01 DIAGNOSIS — E1122 Type 2 diabetes mellitus with diabetic chronic kidney disease: Secondary | ICD-10-CM | POA: Diagnosis not present

## 2018-06-01 DIAGNOSIS — I5022 Chronic systolic (congestive) heart failure: Secondary | ICD-10-CM | POA: Diagnosis not present

## 2018-06-01 DIAGNOSIS — I129 Hypertensive chronic kidney disease with stage 1 through stage 4 chronic kidney disease, or unspecified chronic kidney disease: Secondary | ICD-10-CM | POA: Diagnosis not present

## 2018-06-01 DIAGNOSIS — R4181 Age-related cognitive decline: Secondary | ICD-10-CM | POA: Diagnosis not present

## 2018-06-05 DIAGNOSIS — I5022 Chronic systolic (congestive) heart failure: Secondary | ICD-10-CM | POA: Diagnosis not present

## 2018-06-05 DIAGNOSIS — I129 Hypertensive chronic kidney disease with stage 1 through stage 4 chronic kidney disease, or unspecified chronic kidney disease: Secondary | ICD-10-CM | POA: Diagnosis not present

## 2018-06-05 DIAGNOSIS — R4181 Age-related cognitive decline: Secondary | ICD-10-CM | POA: Diagnosis not present

## 2018-06-05 DIAGNOSIS — R630 Anorexia: Secondary | ICD-10-CM | POA: Diagnosis not present

## 2018-06-05 DIAGNOSIS — I4891 Unspecified atrial fibrillation: Secondary | ICD-10-CM | POA: Diagnosis not present

## 2018-06-05 DIAGNOSIS — E1122 Type 2 diabetes mellitus with diabetic chronic kidney disease: Secondary | ICD-10-CM | POA: Diagnosis not present

## 2018-06-05 DIAGNOSIS — I2510 Atherosclerotic heart disease of native coronary artery without angina pectoris: Secondary | ICD-10-CM | POA: Diagnosis not present

## 2018-06-05 DIAGNOSIS — M6281 Muscle weakness (generalized): Secondary | ICD-10-CM | POA: Diagnosis not present

## 2018-06-05 DIAGNOSIS — F0391 Unspecified dementia with behavioral disturbance: Secondary | ICD-10-CM | POA: Diagnosis not present

## 2018-06-06 DIAGNOSIS — I4891 Unspecified atrial fibrillation: Secondary | ICD-10-CM | POA: Diagnosis not present

## 2018-06-06 DIAGNOSIS — N183 Chronic kidney disease, stage 3 (moderate): Secondary | ICD-10-CM | POA: Diagnosis not present

## 2018-06-06 DIAGNOSIS — I5022 Chronic systolic (congestive) heart failure: Secondary | ICD-10-CM | POA: Diagnosis not present

## 2018-06-06 DIAGNOSIS — I129 Hypertensive chronic kidney disease with stage 1 through stage 4 chronic kidney disease, or unspecified chronic kidney disease: Secondary | ICD-10-CM | POA: Diagnosis not present

## 2018-06-06 DIAGNOSIS — I2510 Atherosclerotic heart disease of native coronary artery without angina pectoris: Secondary | ICD-10-CM | POA: Diagnosis not present

## 2018-06-06 DIAGNOSIS — G309 Alzheimer's disease, unspecified: Secondary | ICD-10-CM | POA: Diagnosis not present

## 2018-06-07 DIAGNOSIS — I2510 Atherosclerotic heart disease of native coronary artery without angina pectoris: Secondary | ICD-10-CM | POA: Diagnosis not present

## 2018-06-07 DIAGNOSIS — I4891 Unspecified atrial fibrillation: Secondary | ICD-10-CM | POA: Diagnosis not present

## 2018-06-07 DIAGNOSIS — R4181 Age-related cognitive decline: Secondary | ICD-10-CM | POA: Diagnosis not present

## 2018-06-07 DIAGNOSIS — E1122 Type 2 diabetes mellitus with diabetic chronic kidney disease: Secondary | ICD-10-CM | POA: Diagnosis not present

## 2018-06-07 DIAGNOSIS — F0391 Unspecified dementia with behavioral disturbance: Secondary | ICD-10-CM | POA: Diagnosis not present

## 2018-06-07 DIAGNOSIS — M6281 Muscle weakness (generalized): Secondary | ICD-10-CM | POA: Diagnosis not present

## 2018-06-07 DIAGNOSIS — I5022 Chronic systolic (congestive) heart failure: Secondary | ICD-10-CM | POA: Diagnosis not present

## 2018-06-07 DIAGNOSIS — I129 Hypertensive chronic kidney disease with stage 1 through stage 4 chronic kidney disease, or unspecified chronic kidney disease: Secondary | ICD-10-CM | POA: Diagnosis not present

## 2018-06-07 DIAGNOSIS — R630 Anorexia: Secondary | ICD-10-CM | POA: Diagnosis not present

## 2018-06-08 DIAGNOSIS — I129 Hypertensive chronic kidney disease with stage 1 through stage 4 chronic kidney disease, or unspecified chronic kidney disease: Secondary | ICD-10-CM | POA: Diagnosis not present

## 2018-06-08 DIAGNOSIS — I2510 Atherosclerotic heart disease of native coronary artery without angina pectoris: Secondary | ICD-10-CM | POA: Diagnosis not present

## 2018-06-08 DIAGNOSIS — I5022 Chronic systolic (congestive) heart failure: Secondary | ICD-10-CM | POA: Diagnosis not present

## 2018-06-08 DIAGNOSIS — I4891 Unspecified atrial fibrillation: Secondary | ICD-10-CM | POA: Diagnosis not present

## 2018-06-08 DIAGNOSIS — E1122 Type 2 diabetes mellitus with diabetic chronic kidney disease: Secondary | ICD-10-CM | POA: Diagnosis not present

## 2018-06-08 DIAGNOSIS — F0391 Unspecified dementia with behavioral disturbance: Secondary | ICD-10-CM | POA: Diagnosis not present

## 2018-06-12 DIAGNOSIS — I2510 Atherosclerotic heart disease of native coronary artery without angina pectoris: Secondary | ICD-10-CM | POA: Diagnosis not present

## 2018-06-12 DIAGNOSIS — I129 Hypertensive chronic kidney disease with stage 1 through stage 4 chronic kidney disease, or unspecified chronic kidney disease: Secondary | ICD-10-CM | POA: Diagnosis not present

## 2018-06-12 DIAGNOSIS — E1122 Type 2 diabetes mellitus with diabetic chronic kidney disease: Secondary | ICD-10-CM | POA: Diagnosis not present

## 2018-06-12 DIAGNOSIS — I4891 Unspecified atrial fibrillation: Secondary | ICD-10-CM | POA: Diagnosis not present

## 2018-06-12 DIAGNOSIS — I5022 Chronic systolic (congestive) heart failure: Secondary | ICD-10-CM | POA: Diagnosis not present

## 2018-06-12 DIAGNOSIS — F0391 Unspecified dementia with behavioral disturbance: Secondary | ICD-10-CM | POA: Diagnosis not present

## 2018-06-15 DIAGNOSIS — I2510 Atherosclerotic heart disease of native coronary artery without angina pectoris: Secondary | ICD-10-CM | POA: Diagnosis not present

## 2018-06-15 DIAGNOSIS — I129 Hypertensive chronic kidney disease with stage 1 through stage 4 chronic kidney disease, or unspecified chronic kidney disease: Secondary | ICD-10-CM | POA: Diagnosis not present

## 2018-06-15 DIAGNOSIS — I4891 Unspecified atrial fibrillation: Secondary | ICD-10-CM | POA: Diagnosis not present

## 2018-06-15 DIAGNOSIS — E1122 Type 2 diabetes mellitus with diabetic chronic kidney disease: Secondary | ICD-10-CM | POA: Diagnosis not present

## 2018-06-15 DIAGNOSIS — I5022 Chronic systolic (congestive) heart failure: Secondary | ICD-10-CM | POA: Diagnosis not present

## 2018-06-15 DIAGNOSIS — F0391 Unspecified dementia with behavioral disturbance: Secondary | ICD-10-CM | POA: Diagnosis not present

## 2018-06-19 DIAGNOSIS — I129 Hypertensive chronic kidney disease with stage 1 through stage 4 chronic kidney disease, or unspecified chronic kidney disease: Secondary | ICD-10-CM | POA: Diagnosis not present

## 2018-06-19 DIAGNOSIS — E1122 Type 2 diabetes mellitus with diabetic chronic kidney disease: Secondary | ICD-10-CM | POA: Diagnosis not present

## 2018-06-19 DIAGNOSIS — I5022 Chronic systolic (congestive) heart failure: Secondary | ICD-10-CM | POA: Diagnosis not present

## 2018-06-19 DIAGNOSIS — I2510 Atherosclerotic heart disease of native coronary artery without angina pectoris: Secondary | ICD-10-CM | POA: Diagnosis not present

## 2018-06-19 DIAGNOSIS — F0391 Unspecified dementia with behavioral disturbance: Secondary | ICD-10-CM | POA: Diagnosis not present

## 2018-06-19 DIAGNOSIS — I4891 Unspecified atrial fibrillation: Secondary | ICD-10-CM | POA: Diagnosis not present

## 2018-06-23 DIAGNOSIS — I2510 Atherosclerotic heart disease of native coronary artery without angina pectoris: Secondary | ICD-10-CM | POA: Diagnosis not present

## 2018-06-23 DIAGNOSIS — F0391 Unspecified dementia with behavioral disturbance: Secondary | ICD-10-CM | POA: Diagnosis not present

## 2018-06-23 DIAGNOSIS — E1122 Type 2 diabetes mellitus with diabetic chronic kidney disease: Secondary | ICD-10-CM | POA: Diagnosis not present

## 2018-06-23 DIAGNOSIS — I4891 Unspecified atrial fibrillation: Secondary | ICD-10-CM | POA: Diagnosis not present

## 2018-06-23 DIAGNOSIS — I5022 Chronic systolic (congestive) heart failure: Secondary | ICD-10-CM | POA: Diagnosis not present

## 2018-06-23 DIAGNOSIS — I129 Hypertensive chronic kidney disease with stage 1 through stage 4 chronic kidney disease, or unspecified chronic kidney disease: Secondary | ICD-10-CM | POA: Diagnosis not present

## 2018-06-27 DIAGNOSIS — E1122 Type 2 diabetes mellitus with diabetic chronic kidney disease: Secondary | ICD-10-CM | POA: Diagnosis not present

## 2018-06-27 DIAGNOSIS — I5022 Chronic systolic (congestive) heart failure: Secondary | ICD-10-CM | POA: Diagnosis not present

## 2018-06-27 DIAGNOSIS — F0391 Unspecified dementia with behavioral disturbance: Secondary | ICD-10-CM | POA: Diagnosis not present

## 2018-06-27 DIAGNOSIS — I129 Hypertensive chronic kidney disease with stage 1 through stage 4 chronic kidney disease, or unspecified chronic kidney disease: Secondary | ICD-10-CM | POA: Diagnosis not present

## 2018-06-27 DIAGNOSIS — I2510 Atherosclerotic heart disease of native coronary artery without angina pectoris: Secondary | ICD-10-CM | POA: Diagnosis not present

## 2018-06-27 DIAGNOSIS — I4891 Unspecified atrial fibrillation: Secondary | ICD-10-CM | POA: Diagnosis not present

## 2018-07-05 DIAGNOSIS — E1122 Type 2 diabetes mellitus with diabetic chronic kidney disease: Secondary | ICD-10-CM | POA: Diagnosis not present

## 2018-07-05 DIAGNOSIS — I5022 Chronic systolic (congestive) heart failure: Secondary | ICD-10-CM | POA: Diagnosis not present

## 2018-07-05 DIAGNOSIS — F0391 Unspecified dementia with behavioral disturbance: Secondary | ICD-10-CM | POA: Diagnosis not present

## 2018-07-05 DIAGNOSIS — I2510 Atherosclerotic heart disease of native coronary artery without angina pectoris: Secondary | ICD-10-CM | POA: Diagnosis not present

## 2018-07-05 DIAGNOSIS — I129 Hypertensive chronic kidney disease with stage 1 through stage 4 chronic kidney disease, or unspecified chronic kidney disease: Secondary | ICD-10-CM | POA: Diagnosis not present

## 2018-07-05 DIAGNOSIS — I4891 Unspecified atrial fibrillation: Secondary | ICD-10-CM | POA: Diagnosis not present

## 2018-07-06 DIAGNOSIS — I2510 Atherosclerotic heart disease of native coronary artery without angina pectoris: Secondary | ICD-10-CM | POA: Diagnosis not present

## 2018-07-06 DIAGNOSIS — I129 Hypertensive chronic kidney disease with stage 1 through stage 4 chronic kidney disease, or unspecified chronic kidney disease: Secondary | ICD-10-CM | POA: Diagnosis not present

## 2018-07-06 DIAGNOSIS — I5022 Chronic systolic (congestive) heart failure: Secondary | ICD-10-CM | POA: Diagnosis not present

## 2018-07-06 DIAGNOSIS — E1122 Type 2 diabetes mellitus with diabetic chronic kidney disease: Secondary | ICD-10-CM | POA: Diagnosis not present

## 2018-07-06 DIAGNOSIS — F0391 Unspecified dementia with behavioral disturbance: Secondary | ICD-10-CM | POA: Diagnosis not present

## 2018-07-06 DIAGNOSIS — I4891 Unspecified atrial fibrillation: Secondary | ICD-10-CM | POA: Diagnosis not present

## 2018-07-07 DIAGNOSIS — M6281 Muscle weakness (generalized): Secondary | ICD-10-CM | POA: Diagnosis not present

## 2018-07-07 DIAGNOSIS — I2510 Atherosclerotic heart disease of native coronary artery without angina pectoris: Secondary | ICD-10-CM | POA: Diagnosis not present

## 2018-07-07 DIAGNOSIS — I5022 Chronic systolic (congestive) heart failure: Secondary | ICD-10-CM | POA: Diagnosis not present

## 2018-07-07 DIAGNOSIS — E1122 Type 2 diabetes mellitus with diabetic chronic kidney disease: Secondary | ICD-10-CM | POA: Diagnosis not present

## 2018-07-07 DIAGNOSIS — F0391 Unspecified dementia with behavioral disturbance: Secondary | ICD-10-CM | POA: Diagnosis not present

## 2018-07-07 DIAGNOSIS — I129 Hypertensive chronic kidney disease with stage 1 through stage 4 chronic kidney disease, or unspecified chronic kidney disease: Secondary | ICD-10-CM | POA: Diagnosis not present

## 2018-07-07 DIAGNOSIS — R4181 Age-related cognitive decline: Secondary | ICD-10-CM | POA: Diagnosis not present

## 2018-07-07 DIAGNOSIS — R630 Anorexia: Secondary | ICD-10-CM | POA: Diagnosis not present

## 2018-07-07 DIAGNOSIS — I4891 Unspecified atrial fibrillation: Secondary | ICD-10-CM | POA: Diagnosis not present

## 2018-07-11 DIAGNOSIS — I129 Hypertensive chronic kidney disease with stage 1 through stage 4 chronic kidney disease, or unspecified chronic kidney disease: Secondary | ICD-10-CM | POA: Diagnosis not present

## 2018-07-11 DIAGNOSIS — I2510 Atherosclerotic heart disease of native coronary artery without angina pectoris: Secondary | ICD-10-CM | POA: Diagnosis not present

## 2018-07-11 DIAGNOSIS — I4891 Unspecified atrial fibrillation: Secondary | ICD-10-CM | POA: Diagnosis not present

## 2018-07-11 DIAGNOSIS — E1122 Type 2 diabetes mellitus with diabetic chronic kidney disease: Secondary | ICD-10-CM | POA: Diagnosis not present

## 2018-07-11 DIAGNOSIS — I5022 Chronic systolic (congestive) heart failure: Secondary | ICD-10-CM | POA: Diagnosis not present

## 2018-07-11 DIAGNOSIS — F0391 Unspecified dementia with behavioral disturbance: Secondary | ICD-10-CM | POA: Diagnosis not present

## 2018-07-18 DIAGNOSIS — I5022 Chronic systolic (congestive) heart failure: Secondary | ICD-10-CM | POA: Diagnosis not present

## 2018-07-18 DIAGNOSIS — F0391 Unspecified dementia with behavioral disturbance: Secondary | ICD-10-CM | POA: Diagnosis not present

## 2018-07-18 DIAGNOSIS — I129 Hypertensive chronic kidney disease with stage 1 through stage 4 chronic kidney disease, or unspecified chronic kidney disease: Secondary | ICD-10-CM | POA: Diagnosis not present

## 2018-07-18 DIAGNOSIS — I4891 Unspecified atrial fibrillation: Secondary | ICD-10-CM | POA: Diagnosis not present

## 2018-07-18 DIAGNOSIS — E1122 Type 2 diabetes mellitus with diabetic chronic kidney disease: Secondary | ICD-10-CM | POA: Diagnosis not present

## 2018-07-18 DIAGNOSIS — I2510 Atherosclerotic heart disease of native coronary artery without angina pectoris: Secondary | ICD-10-CM | POA: Diagnosis not present

## 2018-07-19 DIAGNOSIS — Z515 Encounter for palliative care: Secondary | ICD-10-CM | POA: Diagnosis not present

## 2018-07-19 DIAGNOSIS — K219 Gastro-esophageal reflux disease without esophagitis: Secondary | ICD-10-CM | POA: Diagnosis not present

## 2018-07-19 DIAGNOSIS — F0390 Unspecified dementia without behavioral disturbance: Secondary | ICD-10-CM | POA: Diagnosis not present

## 2018-07-19 DIAGNOSIS — R627 Adult failure to thrive: Secondary | ICD-10-CM | POA: Diagnosis not present

## 2018-07-26 DIAGNOSIS — F0390 Unspecified dementia without behavioral disturbance: Secondary | ICD-10-CM | POA: Diagnosis not present

## 2018-07-26 DIAGNOSIS — I2510 Atherosclerotic heart disease of native coronary artery without angina pectoris: Secondary | ICD-10-CM | POA: Diagnosis not present

## 2018-07-26 DIAGNOSIS — E1122 Type 2 diabetes mellitus with diabetic chronic kidney disease: Secondary | ICD-10-CM | POA: Diagnosis not present

## 2018-07-26 DIAGNOSIS — I5022 Chronic systolic (congestive) heart failure: Secondary | ICD-10-CM | POA: Diagnosis not present

## 2018-07-26 DIAGNOSIS — K219 Gastro-esophageal reflux disease without esophagitis: Secondary | ICD-10-CM | POA: Diagnosis not present

## 2018-07-26 DIAGNOSIS — F0391 Unspecified dementia with behavioral disturbance: Secondary | ICD-10-CM | POA: Diagnosis not present

## 2018-07-26 DIAGNOSIS — I129 Hypertensive chronic kidney disease with stage 1 through stage 4 chronic kidney disease, or unspecified chronic kidney disease: Secondary | ICD-10-CM | POA: Diagnosis not present

## 2018-07-26 DIAGNOSIS — I4891 Unspecified atrial fibrillation: Secondary | ICD-10-CM | POA: Diagnosis not present

## 2018-07-27 DIAGNOSIS — I5022 Chronic systolic (congestive) heart failure: Secondary | ICD-10-CM | POA: Diagnosis not present

## 2018-07-27 DIAGNOSIS — F0391 Unspecified dementia with behavioral disturbance: Secondary | ICD-10-CM | POA: Diagnosis not present

## 2018-07-27 DIAGNOSIS — I2510 Atherosclerotic heart disease of native coronary artery without angina pectoris: Secondary | ICD-10-CM | POA: Diagnosis not present

## 2018-07-27 DIAGNOSIS — I129 Hypertensive chronic kidney disease with stage 1 through stage 4 chronic kidney disease, or unspecified chronic kidney disease: Secondary | ICD-10-CM | POA: Diagnosis not present

## 2018-07-27 DIAGNOSIS — E1122 Type 2 diabetes mellitus with diabetic chronic kidney disease: Secondary | ICD-10-CM | POA: Diagnosis not present

## 2018-07-27 DIAGNOSIS — I4891 Unspecified atrial fibrillation: Secondary | ICD-10-CM | POA: Diagnosis not present

## 2018-07-28 DIAGNOSIS — I129 Hypertensive chronic kidney disease with stage 1 through stage 4 chronic kidney disease, or unspecified chronic kidney disease: Secondary | ICD-10-CM | POA: Diagnosis not present

## 2018-07-28 DIAGNOSIS — I5022 Chronic systolic (congestive) heart failure: Secondary | ICD-10-CM | POA: Diagnosis not present

## 2018-07-28 DIAGNOSIS — I4891 Unspecified atrial fibrillation: Secondary | ICD-10-CM | POA: Diagnosis not present

## 2018-07-28 DIAGNOSIS — F0391 Unspecified dementia with behavioral disturbance: Secondary | ICD-10-CM | POA: Diagnosis not present

## 2018-07-28 DIAGNOSIS — I2510 Atherosclerotic heart disease of native coronary artery without angina pectoris: Secondary | ICD-10-CM | POA: Diagnosis not present

## 2018-07-28 DIAGNOSIS — E1122 Type 2 diabetes mellitus with diabetic chronic kidney disease: Secondary | ICD-10-CM | POA: Diagnosis not present

## 2018-07-29 DIAGNOSIS — I2510 Atherosclerotic heart disease of native coronary artery without angina pectoris: Secondary | ICD-10-CM | POA: Diagnosis not present

## 2018-07-29 DIAGNOSIS — E1122 Type 2 diabetes mellitus with diabetic chronic kidney disease: Secondary | ICD-10-CM | POA: Diagnosis not present

## 2018-07-29 DIAGNOSIS — I5022 Chronic systolic (congestive) heart failure: Secondary | ICD-10-CM | POA: Diagnosis not present

## 2018-07-29 DIAGNOSIS — F0391 Unspecified dementia with behavioral disturbance: Secondary | ICD-10-CM | POA: Diagnosis not present

## 2018-07-29 DIAGNOSIS — I4891 Unspecified atrial fibrillation: Secondary | ICD-10-CM | POA: Diagnosis not present

## 2018-07-29 DIAGNOSIS — I129 Hypertensive chronic kidney disease with stage 1 through stage 4 chronic kidney disease, or unspecified chronic kidney disease: Secondary | ICD-10-CM | POA: Diagnosis not present

## 2018-07-30 DIAGNOSIS — I4891 Unspecified atrial fibrillation: Secondary | ICD-10-CM | POA: Diagnosis not present

## 2018-07-30 DIAGNOSIS — I5022 Chronic systolic (congestive) heart failure: Secondary | ICD-10-CM | POA: Diagnosis not present

## 2018-07-30 DIAGNOSIS — F0391 Unspecified dementia with behavioral disturbance: Secondary | ICD-10-CM | POA: Diagnosis not present

## 2018-07-30 DIAGNOSIS — E1122 Type 2 diabetes mellitus with diabetic chronic kidney disease: Secondary | ICD-10-CM | POA: Diagnosis not present

## 2018-07-30 DIAGNOSIS — I129 Hypertensive chronic kidney disease with stage 1 through stage 4 chronic kidney disease, or unspecified chronic kidney disease: Secondary | ICD-10-CM | POA: Diagnosis not present

## 2018-07-30 DIAGNOSIS — I2510 Atherosclerotic heart disease of native coronary artery without angina pectoris: Secondary | ICD-10-CM | POA: Diagnosis not present

## 2018-07-31 DIAGNOSIS — I5022 Chronic systolic (congestive) heart failure: Secondary | ICD-10-CM | POA: Diagnosis not present

## 2018-07-31 DIAGNOSIS — I129 Hypertensive chronic kidney disease with stage 1 through stage 4 chronic kidney disease, or unspecified chronic kidney disease: Secondary | ICD-10-CM | POA: Diagnosis not present

## 2018-07-31 DIAGNOSIS — F0391 Unspecified dementia with behavioral disturbance: Secondary | ICD-10-CM | POA: Diagnosis not present

## 2018-07-31 DIAGNOSIS — I2510 Atherosclerotic heart disease of native coronary artery without angina pectoris: Secondary | ICD-10-CM | POA: Diagnosis not present

## 2018-07-31 DIAGNOSIS — I4891 Unspecified atrial fibrillation: Secondary | ICD-10-CM | POA: Diagnosis not present

## 2018-07-31 DIAGNOSIS — E1122 Type 2 diabetes mellitus with diabetic chronic kidney disease: Secondary | ICD-10-CM | POA: Diagnosis not present

## 2018-08-01 DIAGNOSIS — F0391 Unspecified dementia with behavioral disturbance: Secondary | ICD-10-CM | POA: Diagnosis not present

## 2018-08-01 DIAGNOSIS — I5022 Chronic systolic (congestive) heart failure: Secondary | ICD-10-CM | POA: Diagnosis not present

## 2018-08-01 DIAGNOSIS — E1122 Type 2 diabetes mellitus with diabetic chronic kidney disease: Secondary | ICD-10-CM | POA: Diagnosis not present

## 2018-08-01 DIAGNOSIS — I129 Hypertensive chronic kidney disease with stage 1 through stage 4 chronic kidney disease, or unspecified chronic kidney disease: Secondary | ICD-10-CM | POA: Diagnosis not present

## 2018-08-01 DIAGNOSIS — I4891 Unspecified atrial fibrillation: Secondary | ICD-10-CM | POA: Diagnosis not present

## 2018-08-01 DIAGNOSIS — I2510 Atherosclerotic heart disease of native coronary artery without angina pectoris: Secondary | ICD-10-CM | POA: Diagnosis not present

## 2018-08-02 DIAGNOSIS — I4891 Unspecified atrial fibrillation: Secondary | ICD-10-CM | POA: Diagnosis not present

## 2018-08-02 DIAGNOSIS — I5022 Chronic systolic (congestive) heart failure: Secondary | ICD-10-CM | POA: Diagnosis not present

## 2018-08-02 DIAGNOSIS — E1122 Type 2 diabetes mellitus with diabetic chronic kidney disease: Secondary | ICD-10-CM | POA: Diagnosis not present

## 2018-08-02 DIAGNOSIS — I129 Hypertensive chronic kidney disease with stage 1 through stage 4 chronic kidney disease, or unspecified chronic kidney disease: Secondary | ICD-10-CM | POA: Diagnosis not present

## 2018-08-02 DIAGNOSIS — I2510 Atherosclerotic heart disease of native coronary artery without angina pectoris: Secondary | ICD-10-CM | POA: Diagnosis not present

## 2018-08-02 DIAGNOSIS — F0391 Unspecified dementia with behavioral disturbance: Secondary | ICD-10-CM | POA: Diagnosis not present

## 2018-08-03 DIAGNOSIS — E1122 Type 2 diabetes mellitus with diabetic chronic kidney disease: Secondary | ICD-10-CM | POA: Diagnosis not present

## 2018-08-03 DIAGNOSIS — I2510 Atherosclerotic heart disease of native coronary artery without angina pectoris: Secondary | ICD-10-CM | POA: Diagnosis not present

## 2018-08-03 DIAGNOSIS — I129 Hypertensive chronic kidney disease with stage 1 through stage 4 chronic kidney disease, or unspecified chronic kidney disease: Secondary | ICD-10-CM | POA: Diagnosis not present

## 2018-08-03 DIAGNOSIS — I5022 Chronic systolic (congestive) heart failure: Secondary | ICD-10-CM | POA: Diagnosis not present

## 2018-08-03 DIAGNOSIS — I4891 Unspecified atrial fibrillation: Secondary | ICD-10-CM | POA: Diagnosis not present

## 2018-08-03 DIAGNOSIS — F0391 Unspecified dementia with behavioral disturbance: Secondary | ICD-10-CM | POA: Diagnosis not present

## 2018-08-04 DIAGNOSIS — I129 Hypertensive chronic kidney disease with stage 1 through stage 4 chronic kidney disease, or unspecified chronic kidney disease: Secondary | ICD-10-CM | POA: Diagnosis not present

## 2018-08-04 DIAGNOSIS — I4891 Unspecified atrial fibrillation: Secondary | ICD-10-CM | POA: Diagnosis not present

## 2018-08-04 DIAGNOSIS — E1122 Type 2 diabetes mellitus with diabetic chronic kidney disease: Secondary | ICD-10-CM | POA: Diagnosis not present

## 2018-08-04 DIAGNOSIS — I2510 Atherosclerotic heart disease of native coronary artery without angina pectoris: Secondary | ICD-10-CM | POA: Diagnosis not present

## 2018-08-04 DIAGNOSIS — F0391 Unspecified dementia with behavioral disturbance: Secondary | ICD-10-CM | POA: Diagnosis not present

## 2018-08-04 DIAGNOSIS — I5022 Chronic systolic (congestive) heart failure: Secondary | ICD-10-CM | POA: Diagnosis not present

## 2018-08-07 DIAGNOSIS — E1122 Type 2 diabetes mellitus with diabetic chronic kidney disease: Secondary | ICD-10-CM | POA: Diagnosis not present

## 2018-08-07 DIAGNOSIS — F0391 Unspecified dementia with behavioral disturbance: Secondary | ICD-10-CM | POA: Diagnosis not present

## 2018-08-07 DIAGNOSIS — I4891 Unspecified atrial fibrillation: Secondary | ICD-10-CM | POA: Diagnosis not present

## 2018-08-07 DIAGNOSIS — R630 Anorexia: Secondary | ICD-10-CM | POA: Diagnosis not present

## 2018-08-07 DIAGNOSIS — I5022 Chronic systolic (congestive) heart failure: Secondary | ICD-10-CM | POA: Diagnosis not present

## 2018-08-07 DIAGNOSIS — I129 Hypertensive chronic kidney disease with stage 1 through stage 4 chronic kidney disease, or unspecified chronic kidney disease: Secondary | ICD-10-CM | POA: Diagnosis not present

## 2018-08-07 DIAGNOSIS — M6281 Muscle weakness (generalized): Secondary | ICD-10-CM | POA: Diagnosis not present

## 2018-08-07 DIAGNOSIS — R4181 Age-related cognitive decline: Secondary | ICD-10-CM | POA: Diagnosis not present

## 2018-08-07 DIAGNOSIS — I2510 Atherosclerotic heart disease of native coronary artery without angina pectoris: Secondary | ICD-10-CM | POA: Diagnosis not present

## 2018-08-08 DIAGNOSIS — I129 Hypertensive chronic kidney disease with stage 1 through stage 4 chronic kidney disease, or unspecified chronic kidney disease: Secondary | ICD-10-CM | POA: Diagnosis not present

## 2018-08-08 DIAGNOSIS — I4891 Unspecified atrial fibrillation: Secondary | ICD-10-CM | POA: Diagnosis not present

## 2018-08-08 DIAGNOSIS — E1122 Type 2 diabetes mellitus with diabetic chronic kidney disease: Secondary | ICD-10-CM | POA: Diagnosis not present

## 2018-08-08 DIAGNOSIS — F0391 Unspecified dementia with behavioral disturbance: Secondary | ICD-10-CM | POA: Diagnosis not present

## 2018-08-08 DIAGNOSIS — I2510 Atherosclerotic heart disease of native coronary artery without angina pectoris: Secondary | ICD-10-CM | POA: Diagnosis not present

## 2018-08-08 DIAGNOSIS — I5022 Chronic systolic (congestive) heart failure: Secondary | ICD-10-CM | POA: Diagnosis not present

## 2018-08-09 DIAGNOSIS — I4891 Unspecified atrial fibrillation: Secondary | ICD-10-CM | POA: Diagnosis not present

## 2018-08-09 DIAGNOSIS — I129 Hypertensive chronic kidney disease with stage 1 through stage 4 chronic kidney disease, or unspecified chronic kidney disease: Secondary | ICD-10-CM | POA: Diagnosis not present

## 2018-08-09 DIAGNOSIS — F0391 Unspecified dementia with behavioral disturbance: Secondary | ICD-10-CM | POA: Diagnosis not present

## 2018-08-09 DIAGNOSIS — I2510 Atherosclerotic heart disease of native coronary artery without angina pectoris: Secondary | ICD-10-CM | POA: Diagnosis not present

## 2018-08-09 DIAGNOSIS — I5022 Chronic systolic (congestive) heart failure: Secondary | ICD-10-CM | POA: Diagnosis not present

## 2018-08-09 DIAGNOSIS — E1122 Type 2 diabetes mellitus with diabetic chronic kidney disease: Secondary | ICD-10-CM | POA: Diagnosis not present

## 2018-08-10 DIAGNOSIS — E1122 Type 2 diabetes mellitus with diabetic chronic kidney disease: Secondary | ICD-10-CM | POA: Diagnosis not present

## 2018-08-10 DIAGNOSIS — I4891 Unspecified atrial fibrillation: Secondary | ICD-10-CM | POA: Diagnosis not present

## 2018-08-10 DIAGNOSIS — I129 Hypertensive chronic kidney disease with stage 1 through stage 4 chronic kidney disease, or unspecified chronic kidney disease: Secondary | ICD-10-CM | POA: Diagnosis not present

## 2018-08-10 DIAGNOSIS — I5022 Chronic systolic (congestive) heart failure: Secondary | ICD-10-CM | POA: Diagnosis not present

## 2018-08-10 DIAGNOSIS — I2510 Atherosclerotic heart disease of native coronary artery without angina pectoris: Secondary | ICD-10-CM | POA: Diagnosis not present

## 2018-08-10 DIAGNOSIS — F0391 Unspecified dementia with behavioral disturbance: Secondary | ICD-10-CM | POA: Diagnosis not present

## 2018-08-11 DIAGNOSIS — F0391 Unspecified dementia with behavioral disturbance: Secondary | ICD-10-CM | POA: Diagnosis not present

## 2018-08-11 DIAGNOSIS — I129 Hypertensive chronic kidney disease with stage 1 through stage 4 chronic kidney disease, or unspecified chronic kidney disease: Secondary | ICD-10-CM | POA: Diagnosis not present

## 2018-08-11 DIAGNOSIS — I5022 Chronic systolic (congestive) heart failure: Secondary | ICD-10-CM | POA: Diagnosis not present

## 2018-08-11 DIAGNOSIS — I4891 Unspecified atrial fibrillation: Secondary | ICD-10-CM | POA: Diagnosis not present

## 2018-08-11 DIAGNOSIS — I2510 Atherosclerotic heart disease of native coronary artery without angina pectoris: Secondary | ICD-10-CM | POA: Diagnosis not present

## 2018-08-11 DIAGNOSIS — E1122 Type 2 diabetes mellitus with diabetic chronic kidney disease: Secondary | ICD-10-CM | POA: Diagnosis not present

## 2018-08-13 DIAGNOSIS — F0391 Unspecified dementia with behavioral disturbance: Secondary | ICD-10-CM | POA: Diagnosis not present

## 2018-08-13 DIAGNOSIS — I5022 Chronic systolic (congestive) heart failure: Secondary | ICD-10-CM | POA: Diagnosis not present

## 2018-08-13 DIAGNOSIS — I4891 Unspecified atrial fibrillation: Secondary | ICD-10-CM | POA: Diagnosis not present

## 2018-08-13 DIAGNOSIS — I129 Hypertensive chronic kidney disease with stage 1 through stage 4 chronic kidney disease, or unspecified chronic kidney disease: Secondary | ICD-10-CM | POA: Diagnosis not present

## 2018-08-13 DIAGNOSIS — I2510 Atherosclerotic heart disease of native coronary artery without angina pectoris: Secondary | ICD-10-CM | POA: Diagnosis not present

## 2018-08-13 DIAGNOSIS — E1122 Type 2 diabetes mellitus with diabetic chronic kidney disease: Secondary | ICD-10-CM | POA: Diagnosis not present

## 2018-08-14 DIAGNOSIS — F0390 Unspecified dementia without behavioral disturbance: Secondary | ICD-10-CM | POA: Diagnosis not present

## 2018-08-14 DIAGNOSIS — L989 Disorder of the skin and subcutaneous tissue, unspecified: Secondary | ICD-10-CM | POA: Diagnosis not present

## 2018-08-15 DIAGNOSIS — E1122 Type 2 diabetes mellitus with diabetic chronic kidney disease: Secondary | ICD-10-CM | POA: Diagnosis not present

## 2018-08-15 DIAGNOSIS — F0391 Unspecified dementia with behavioral disturbance: Secondary | ICD-10-CM | POA: Diagnosis not present

## 2018-08-15 DIAGNOSIS — I129 Hypertensive chronic kidney disease with stage 1 through stage 4 chronic kidney disease, or unspecified chronic kidney disease: Secondary | ICD-10-CM | POA: Diagnosis not present

## 2018-08-15 DIAGNOSIS — I5022 Chronic systolic (congestive) heart failure: Secondary | ICD-10-CM | POA: Diagnosis not present

## 2018-08-15 DIAGNOSIS — I2510 Atherosclerotic heart disease of native coronary artery without angina pectoris: Secondary | ICD-10-CM | POA: Diagnosis not present

## 2018-08-15 DIAGNOSIS — I4891 Unspecified atrial fibrillation: Secondary | ICD-10-CM | POA: Diagnosis not present

## 2018-08-16 DIAGNOSIS — I129 Hypertensive chronic kidney disease with stage 1 through stage 4 chronic kidney disease, or unspecified chronic kidney disease: Secondary | ICD-10-CM | POA: Diagnosis not present

## 2018-08-16 DIAGNOSIS — I2510 Atherosclerotic heart disease of native coronary artery without angina pectoris: Secondary | ICD-10-CM | POA: Diagnosis not present

## 2018-08-16 DIAGNOSIS — E1122 Type 2 diabetes mellitus with diabetic chronic kidney disease: Secondary | ICD-10-CM | POA: Diagnosis not present

## 2018-08-16 DIAGNOSIS — I5022 Chronic systolic (congestive) heart failure: Secondary | ICD-10-CM | POA: Diagnosis not present

## 2018-08-16 DIAGNOSIS — I4891 Unspecified atrial fibrillation: Secondary | ICD-10-CM | POA: Diagnosis not present

## 2018-08-16 DIAGNOSIS — F0391 Unspecified dementia with behavioral disturbance: Secondary | ICD-10-CM | POA: Diagnosis not present

## 2018-08-17 DIAGNOSIS — I2510 Atherosclerotic heart disease of native coronary artery without angina pectoris: Secondary | ICD-10-CM | POA: Diagnosis not present

## 2018-08-17 DIAGNOSIS — I129 Hypertensive chronic kidney disease with stage 1 through stage 4 chronic kidney disease, or unspecified chronic kidney disease: Secondary | ICD-10-CM | POA: Diagnosis not present

## 2018-08-17 DIAGNOSIS — E1122 Type 2 diabetes mellitus with diabetic chronic kidney disease: Secondary | ICD-10-CM | POA: Diagnosis not present

## 2018-08-17 DIAGNOSIS — F0391 Unspecified dementia with behavioral disturbance: Secondary | ICD-10-CM | POA: Diagnosis not present

## 2018-08-17 DIAGNOSIS — I4891 Unspecified atrial fibrillation: Secondary | ICD-10-CM | POA: Diagnosis not present

## 2018-08-17 DIAGNOSIS — I5022 Chronic systolic (congestive) heart failure: Secondary | ICD-10-CM | POA: Diagnosis not present

## 2018-08-21 DIAGNOSIS — F0391 Unspecified dementia with behavioral disturbance: Secondary | ICD-10-CM | POA: Diagnosis not present

## 2018-08-21 DIAGNOSIS — I2510 Atherosclerotic heart disease of native coronary artery without angina pectoris: Secondary | ICD-10-CM | POA: Diagnosis not present

## 2018-08-21 DIAGNOSIS — I5022 Chronic systolic (congestive) heart failure: Secondary | ICD-10-CM | POA: Diagnosis not present

## 2018-08-21 DIAGNOSIS — I129 Hypertensive chronic kidney disease with stage 1 through stage 4 chronic kidney disease, or unspecified chronic kidney disease: Secondary | ICD-10-CM | POA: Diagnosis not present

## 2018-08-21 DIAGNOSIS — I4891 Unspecified atrial fibrillation: Secondary | ICD-10-CM | POA: Diagnosis not present

## 2018-08-21 DIAGNOSIS — E1122 Type 2 diabetes mellitus with diabetic chronic kidney disease: Secondary | ICD-10-CM | POA: Diagnosis not present

## 2018-08-23 DIAGNOSIS — I5022 Chronic systolic (congestive) heart failure: Secondary | ICD-10-CM | POA: Diagnosis not present

## 2018-08-23 DIAGNOSIS — F0390 Unspecified dementia without behavioral disturbance: Secondary | ICD-10-CM | POA: Diagnosis not present

## 2018-08-23 DIAGNOSIS — E1122 Type 2 diabetes mellitus with diabetic chronic kidney disease: Secondary | ICD-10-CM | POA: Diagnosis not present

## 2018-08-23 DIAGNOSIS — I129 Hypertensive chronic kidney disease with stage 1 through stage 4 chronic kidney disease, or unspecified chronic kidney disease: Secondary | ICD-10-CM | POA: Diagnosis not present

## 2018-08-23 DIAGNOSIS — I4891 Unspecified atrial fibrillation: Secondary | ICD-10-CM | POA: Diagnosis not present

## 2018-08-23 DIAGNOSIS — Z515 Encounter for palliative care: Secondary | ICD-10-CM | POA: Diagnosis not present

## 2018-08-23 DIAGNOSIS — I2510 Atherosclerotic heart disease of native coronary artery without angina pectoris: Secondary | ICD-10-CM | POA: Diagnosis not present

## 2018-08-23 DIAGNOSIS — F0391 Unspecified dementia with behavioral disturbance: Secondary | ICD-10-CM | POA: Diagnosis not present

## 2018-08-25 DIAGNOSIS — F0391 Unspecified dementia with behavioral disturbance: Secondary | ICD-10-CM | POA: Diagnosis not present

## 2018-08-25 DIAGNOSIS — E1122 Type 2 diabetes mellitus with diabetic chronic kidney disease: Secondary | ICD-10-CM | POA: Diagnosis not present

## 2018-08-25 DIAGNOSIS — I4891 Unspecified atrial fibrillation: Secondary | ICD-10-CM | POA: Diagnosis not present

## 2018-08-25 DIAGNOSIS — I129 Hypertensive chronic kidney disease with stage 1 through stage 4 chronic kidney disease, or unspecified chronic kidney disease: Secondary | ICD-10-CM | POA: Diagnosis not present

## 2018-08-25 DIAGNOSIS — I5022 Chronic systolic (congestive) heart failure: Secondary | ICD-10-CM | POA: Diagnosis not present

## 2018-08-25 DIAGNOSIS — I2510 Atherosclerotic heart disease of native coronary artery without angina pectoris: Secondary | ICD-10-CM | POA: Diagnosis not present

## 2018-08-28 DIAGNOSIS — I2510 Atherosclerotic heart disease of native coronary artery without angina pectoris: Secondary | ICD-10-CM | POA: Diagnosis not present

## 2018-08-28 DIAGNOSIS — I129 Hypertensive chronic kidney disease with stage 1 through stage 4 chronic kidney disease, or unspecified chronic kidney disease: Secondary | ICD-10-CM | POA: Diagnosis not present

## 2018-08-28 DIAGNOSIS — I5022 Chronic systolic (congestive) heart failure: Secondary | ICD-10-CM | POA: Diagnosis not present

## 2018-08-28 DIAGNOSIS — E1122 Type 2 diabetes mellitus with diabetic chronic kidney disease: Secondary | ICD-10-CM | POA: Diagnosis not present

## 2018-08-28 DIAGNOSIS — I4891 Unspecified atrial fibrillation: Secondary | ICD-10-CM | POA: Diagnosis not present

## 2018-08-28 DIAGNOSIS — F0391 Unspecified dementia with behavioral disturbance: Secondary | ICD-10-CM | POA: Diagnosis not present

## 2018-08-30 DIAGNOSIS — I129 Hypertensive chronic kidney disease with stage 1 through stage 4 chronic kidney disease, or unspecified chronic kidney disease: Secondary | ICD-10-CM | POA: Diagnosis not present

## 2018-08-30 DIAGNOSIS — I5022 Chronic systolic (congestive) heart failure: Secondary | ICD-10-CM | POA: Diagnosis not present

## 2018-08-30 DIAGNOSIS — I2510 Atherosclerotic heart disease of native coronary artery without angina pectoris: Secondary | ICD-10-CM | POA: Diagnosis not present

## 2018-08-30 DIAGNOSIS — I4891 Unspecified atrial fibrillation: Secondary | ICD-10-CM | POA: Diagnosis not present

## 2018-08-30 DIAGNOSIS — E1122 Type 2 diabetes mellitus with diabetic chronic kidney disease: Secondary | ICD-10-CM | POA: Diagnosis not present

## 2018-08-30 DIAGNOSIS — F0391 Unspecified dementia with behavioral disturbance: Secondary | ICD-10-CM | POA: Diagnosis not present

## 2018-08-31 DIAGNOSIS — I5022 Chronic systolic (congestive) heart failure: Secondary | ICD-10-CM | POA: Diagnosis not present

## 2018-08-31 DIAGNOSIS — I129 Hypertensive chronic kidney disease with stage 1 through stage 4 chronic kidney disease, or unspecified chronic kidney disease: Secondary | ICD-10-CM | POA: Diagnosis not present

## 2018-08-31 DIAGNOSIS — I2510 Atherosclerotic heart disease of native coronary artery without angina pectoris: Secondary | ICD-10-CM | POA: Diagnosis not present

## 2018-08-31 DIAGNOSIS — F0391 Unspecified dementia with behavioral disturbance: Secondary | ICD-10-CM | POA: Diagnosis not present

## 2018-08-31 DIAGNOSIS — E1122 Type 2 diabetes mellitus with diabetic chronic kidney disease: Secondary | ICD-10-CM | POA: Diagnosis not present

## 2018-08-31 DIAGNOSIS — I4891 Unspecified atrial fibrillation: Secondary | ICD-10-CM | POA: Diagnosis not present

## 2018-09-04 DIAGNOSIS — E1122 Type 2 diabetes mellitus with diabetic chronic kidney disease: Secondary | ICD-10-CM | POA: Diagnosis not present

## 2018-09-04 DIAGNOSIS — M25521 Pain in right elbow: Secondary | ICD-10-CM | POA: Diagnosis not present

## 2018-09-04 DIAGNOSIS — I5022 Chronic systolic (congestive) heart failure: Secondary | ICD-10-CM | POA: Diagnosis not present

## 2018-09-04 DIAGNOSIS — S72141A Displaced intertrochanteric fracture of right femur, initial encounter for closed fracture: Secondary | ICD-10-CM | POA: Diagnosis not present

## 2018-09-04 DIAGNOSIS — S0083XA Contusion of other part of head, initial encounter: Secondary | ICD-10-CM | POA: Diagnosis not present

## 2018-09-04 DIAGNOSIS — M25551 Pain in right hip: Secondary | ICD-10-CM | POA: Diagnosis not present

## 2018-09-04 DIAGNOSIS — I4891 Unspecified atrial fibrillation: Secondary | ICD-10-CM | POA: Diagnosis not present

## 2018-09-04 DIAGNOSIS — I129 Hypertensive chronic kidney disease with stage 1 through stage 4 chronic kidney disease, or unspecified chronic kidney disease: Secondary | ICD-10-CM | POA: Diagnosis not present

## 2018-09-04 DIAGNOSIS — I2510 Atherosclerotic heart disease of native coronary artery without angina pectoris: Secondary | ICD-10-CM | POA: Diagnosis not present

## 2018-09-04 DIAGNOSIS — F0390 Unspecified dementia without behavioral disturbance: Secondary | ICD-10-CM | POA: Diagnosis not present

## 2018-09-04 DIAGNOSIS — F0391 Unspecified dementia with behavioral disturbance: Secondary | ICD-10-CM | POA: Diagnosis not present

## 2018-09-04 DIAGNOSIS — S51011A Laceration without foreign body of right elbow, initial encounter: Secondary | ICD-10-CM | POA: Diagnosis not present

## 2018-09-04 DIAGNOSIS — S81011A Laceration without foreign body, right knee, initial encounter: Secondary | ICD-10-CM | POA: Diagnosis not present

## 2018-09-04 DIAGNOSIS — S40021A Contusion of right upper arm, initial encounter: Secondary | ICD-10-CM | POA: Diagnosis not present

## 2018-09-05 DIAGNOSIS — N183 Chronic kidney disease, stage 3 (moderate): Secondary | ICD-10-CM | POA: Diagnosis not present

## 2018-09-05 DIAGNOSIS — E1122 Type 2 diabetes mellitus with diabetic chronic kidney disease: Secondary | ICD-10-CM | POA: Diagnosis not present

## 2018-09-05 DIAGNOSIS — I4891 Unspecified atrial fibrillation: Secondary | ICD-10-CM | POA: Diagnosis not present

## 2018-09-05 DIAGNOSIS — F0391 Unspecified dementia with behavioral disturbance: Secondary | ICD-10-CM | POA: Diagnosis not present

## 2018-09-05 DIAGNOSIS — I5022 Chronic systolic (congestive) heart failure: Secondary | ICD-10-CM | POA: Diagnosis not present

## 2018-09-05 DIAGNOSIS — W19XXXS Unspecified fall, sequela: Secondary | ICD-10-CM | POA: Diagnosis not present

## 2018-09-05 DIAGNOSIS — F0390 Unspecified dementia without behavioral disturbance: Secondary | ICD-10-CM | POA: Diagnosis not present

## 2018-09-05 DIAGNOSIS — I2510 Atherosclerotic heart disease of native coronary artery without angina pectoris: Secondary | ICD-10-CM | POA: Diagnosis not present

## 2018-09-05 DIAGNOSIS — S7291XS Unspecified fracture of right femur, sequela: Secondary | ICD-10-CM | POA: Diagnosis not present

## 2018-09-05 DIAGNOSIS — I129 Hypertensive chronic kidney disease with stage 1 through stage 4 chronic kidney disease, or unspecified chronic kidney disease: Secondary | ICD-10-CM | POA: Diagnosis not present

## 2018-09-06 DIAGNOSIS — I2510 Atherosclerotic heart disease of native coronary artery without angina pectoris: Secondary | ICD-10-CM | POA: Diagnosis not present

## 2018-09-06 DIAGNOSIS — I5022 Chronic systolic (congestive) heart failure: Secondary | ICD-10-CM | POA: Diagnosis not present

## 2018-09-06 DIAGNOSIS — R630 Anorexia: Secondary | ICD-10-CM | POA: Diagnosis not present

## 2018-09-06 DIAGNOSIS — R4181 Age-related cognitive decline: Secondary | ICD-10-CM | POA: Diagnosis not present

## 2018-09-06 DIAGNOSIS — I129 Hypertensive chronic kidney disease with stage 1 through stage 4 chronic kidney disease, or unspecified chronic kidney disease: Secondary | ICD-10-CM | POA: Diagnosis not present

## 2018-09-06 DIAGNOSIS — E1122 Type 2 diabetes mellitus with diabetic chronic kidney disease: Secondary | ICD-10-CM | POA: Diagnosis not present

## 2018-09-06 DIAGNOSIS — I4891 Unspecified atrial fibrillation: Secondary | ICD-10-CM | POA: Diagnosis not present

## 2018-09-06 DIAGNOSIS — M6281 Muscle weakness (generalized): Secondary | ICD-10-CM | POA: Diagnosis not present

## 2018-09-06 DIAGNOSIS — F0391 Unspecified dementia with behavioral disturbance: Secondary | ICD-10-CM | POA: Diagnosis not present

## 2018-10-04 ENCOUNTER — Other Ambulatory Visit: Payer: Self-pay

## 2018-10-07 DEATH — deceased
